# Patient Record
Sex: Male | Born: 1960 | Race: White | Hispanic: No | Marital: Married | Smoking: Current every day smoker
Health system: Southern US, Community
[De-identification: ages and names within clinical notes are randomized; demographics above are authoritative.]

## PROBLEM LIST (undated history)

## (undated) DIAGNOSIS — M1990 Unspecified osteoarthritis, unspecified site: Secondary | ICD-10-CM

## (undated) DIAGNOSIS — E039 Hypothyroidism, unspecified: Secondary | ICD-10-CM

## (undated) DIAGNOSIS — J449 Chronic obstructive pulmonary disease, unspecified: Secondary | ICD-10-CM

## (undated) DIAGNOSIS — IMO0002 Reserved for concepts with insufficient information to code with codable children: Secondary | ICD-10-CM

## (undated) DIAGNOSIS — T7840XA Allergy, unspecified, initial encounter: Secondary | ICD-10-CM

## (undated) DIAGNOSIS — E079 Disorder of thyroid, unspecified: Secondary | ICD-10-CM

## (undated) DIAGNOSIS — Z952 Presence of prosthetic heart valve: Secondary | ICD-10-CM

## (undated) DIAGNOSIS — Z8619 Personal history of other infectious and parasitic diseases: Secondary | ICD-10-CM

## (undated) HISTORY — DX: Personal history of other infectious and parasitic diseases: Z86.19

## (undated) HISTORY — DX: Reserved for concepts with insufficient information to code with codable children: IMO0002

## (undated) HISTORY — DX: Allergy, unspecified, initial encounter: T78.40XA

## (undated) HISTORY — DX: Unspecified osteoarthritis, unspecified site: M19.90

## (undated) HISTORY — DX: Hypothyroidism, unspecified: E03.9

## (undated) HISTORY — PX: CARDIAC SURGERY: SHX584

## (undated) HISTORY — DX: Presence of prosthetic heart valve: Z95.2

## (undated) HISTORY — PX: AORTA SURGERY: SHX548

---

## 1998-10-25 HISTORY — PX: CARDIAC VALVE SURGERY: SHX40

## 1999-03-27 HISTORY — PX: LUNG SURGERY: SHX703

## 2010-01-24 HISTORY — PX: LUNG BIOPSY: SHX5088

## 2011-04-02 ENCOUNTER — Institutional Professional Consult (permissible substitution): Payer: Self-pay | Admitting: Pulmonary Disease

## 2011-06-28 ENCOUNTER — Institutional Professional Consult (permissible substitution): Payer: Self-pay | Admitting: Internal Medicine

## 2011-10-11 ENCOUNTER — Emergency Department (HOSPITAL_COMMUNITY): Payer: Medicare Other

## 2011-10-11 ENCOUNTER — Other Ambulatory Visit: Payer: Self-pay

## 2011-10-11 ENCOUNTER — Emergency Department (HOSPITAL_COMMUNITY)
Admission: EM | Admit: 2011-10-11 | Discharge: 2011-10-12 | Disposition: A | Discharge status: Home / Self Care | Payer: Medicare Other | Attending: Emergency Medicine | Admitting: Emergency Medicine

## 2011-10-11 ENCOUNTER — Encounter (HOSPITAL_COMMUNITY): Payer: Self-pay | Admitting: Emergency Medicine

## 2011-10-11 DIAGNOSIS — J4489 Other specified chronic obstructive pulmonary disease: Secondary | ICD-10-CM | POA: Insufficient documentation

## 2011-10-11 DIAGNOSIS — R64 Cachexia: Secondary | ICD-10-CM | POA: Insufficient documentation

## 2011-10-11 DIAGNOSIS — J449 Chronic obstructive pulmonary disease, unspecified: Secondary | ICD-10-CM | POA: Insufficient documentation

## 2011-10-11 DIAGNOSIS — R011 Cardiac murmur, unspecified: Secondary | ICD-10-CM | POA: Insufficient documentation

## 2011-10-11 DIAGNOSIS — R079 Chest pain, unspecified: Secondary | ICD-10-CM | POA: Insufficient documentation

## 2011-10-11 DIAGNOSIS — Z79899 Other long term (current) drug therapy: Secondary | ICD-10-CM | POA: Insufficient documentation

## 2011-10-11 DIAGNOSIS — R42 Dizziness and giddiness: Secondary | ICD-10-CM

## 2011-10-11 DIAGNOSIS — R0602 Shortness of breath: Secondary | ICD-10-CM | POA: Insufficient documentation

## 2011-10-11 DIAGNOSIS — E079 Disorder of thyroid, unspecified: Secondary | ICD-10-CM | POA: Insufficient documentation

## 2011-10-11 HISTORY — DX: Disorder of thyroid, unspecified: E07.9

## 2011-10-11 HISTORY — DX: Chronic obstructive pulmonary disease, unspecified: J44.9

## 2011-10-11 LAB — CBC
HCT: 32 % — ABNORMAL LOW (ref 39.0–52.0)
Hemoglobin: 10.3 g/dL — ABNORMAL LOW (ref 13.0–17.0)
MCH: 28 pg (ref 26.0–34.0)
MCV: 87 fL (ref 78.0–100.0)
RBC: 3.68 MIL/uL — ABNORMAL LOW (ref 4.22–5.81)

## 2011-10-11 LAB — COMPREHENSIVE METABOLIC PANEL
Alkaline Phosphatase: 76 U/L (ref 39–117)
BUN: 14 mg/dL (ref 6–23)
Creatinine, Ser: 0.82 mg/dL (ref 0.50–1.35)
GFR calc Af Amer: >90 mL/min (ref >90)
Glucose, Bld: 85 mg/dL (ref 70–99)
Potassium: 4 mEq/L (ref 3.5–5.1)
Total Bilirubin: 0.2 mg/dL — ABNORMAL LOW (ref 0.3–1.2)
Total Protein: 6.8 g/dL (ref 6.0–8.3)

## 2011-10-11 LAB — DIFFERENTIAL
Eosinophils Absolute: 0.3 10*3/uL (ref 0.0–0.7)
Eosinophils Relative: 4 % (ref 0–5)
Lymphs Abs: 1.8 10*3/uL (ref 0.7–4.0)
Monocytes Absolute: 0.9 10*3/uL (ref 0.1–1.0)
Monocytes Relative: 10 % (ref 3–12)
Neutrophils Relative %: 65 % (ref 43–77)

## 2011-10-11 LAB — POCT I-STAT TROPONIN I: Troponin i, poc: 0 ng/mL (ref 0.00–0.08)

## 2011-10-11 MED ORDER — MECLIZINE HCL 50 MG PO TABS: 25.0000 mg | ORAL_TABLET | Freq: Three times a day (TID) | ORAL | Status: AC | PRN

## 2011-10-11 NOTE — ED Provider Notes (Signed)
Medical screening examination/treatment/procedure(s) were conducted as a shared visit with non-physician practitioner(s) and myself.  I personally evaluated the patient during the encounter The patient is a 51 year old, male, smoker who presents emergency department complaining of a cough with sputum, as well as right sided chest pain in his ribs.  He also has intermittent substernal chest pain is substernal chest pain.  However, it lasts only for one to 2 minutes.  He has had dizziness.  He describes as a spinning sensation.  With no nausea, vomiting, diarrhea he is pain-free at this time.  He is no distress.  On examination.  He is a cachectic male, with a normal neurological examination.  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  His abdomen is benign.  Lab tests do not show any signs of cardiac ischemia.  We will let him go and treat him symptomatically with meclizine to  Nicholes Stairs, MD 10/11/11 [phone]

## 2011-10-11 NOTE — ED Provider Notes (Signed)
History     CSN: [id number]  Arrival date & time 10/11/11  2023   First MD Initiated Contact with Patient 10/11/11 2052      No chief complaint on file.   (Consider location/radiation/quality/duration/timing/severity/associated sxs/prior treatment) HPI History is obtained from the patient and family member at bedside. He presents with several days of dizziness. He is unable to describe what kind of sensation he is feeling. It has not been associated with sudden movements or sudden turning of the head. Denies any associated hearing loss or tinnitus. He has not had any visual changes. He has had some associated chest pain but denies any shortness of breath. The pain is described as sharp in nature and is very intermittent, lasting only 1-2 sec at a time. He does have a history of COPD, but denies have any wheezing. Denies abdominal pain, nausea, vomiting, hematochezia or melena.  He is status post cardiac valve replacement with a metal valve, but is unsure which valve was replaced.  Past Medical History  Diagnosis Date  . COPD (chronic obstructive pulmonary disease)   . Thyroid disease     Past Surgical History  Procedure Date  . Cardiac surgery   . Lung surgery 03/1999  . Cardiac valve surgery 10/1998    metal valve  . Lung biopsy   Patient states his lung surgery was because he had a "fungus". States a portion of his right upper lung was removed. He is unsure which valve was replaced.  No family history on file.  History  Substance Use Topics  . Smoking status: Not on file  . Smokeless tobacco: Not on file  . Alcohol Use: No      Review of Systems  Constitutional: Negative for fever, chills, activity change and appetite change.  HENT: Negative for hearing loss, ear pain, nosebleeds, congestion, neck pain, sinus pressure and tinnitus.   Eyes: Negative for photophobia and visual disturbance.  Respiratory: Negative for chest tightness and shortness of breath.     Cardiovascular: Positive for chest pain. Negative for palpitations and leg swelling.  Gastrointestinal: Negative for nausea, vomiting and abdominal pain.  Musculoskeletal: Negative for myalgias.  Skin: Negative for color change and rash.  Neurological: Positive for dizziness. Negative for weakness, numbness and headaches.    Allergies  Penicillins  Home Medications   Current Outpatient Rx  Name Route Sig Dispense Refill  . ALBUTEROL SULFATE (2.5 MG/3ML) 0.083% IN NEBU Nebulization Take 2.5 mg by nebulization every 6 (six) hours as needed. For shortness of breath    . ARFORMOTEROL TARTRATE 15 MCG/2ML IN NEBU Nebulization Take 15 mcg by nebulization 2 (two) times daily.    . ASPIRIN EC 81 MG PO TBEC Oral Take 162 mg by mouth daily.    . ATENOLOL 25 MG PO TABS Oral Take 25 mg by mouth daily.    . BUDESONIDE 0.25 MG/2ML IN SUSP Nebulization Take 0.25 mg by nebulization 2 (two) times daily.    Marland Kitchen HYDROCODONE-ACETAMINOPHEN 5-325 MG PO TABS Oral Take 1 tablet by mouth every 8 (eight) hours as needed. For pain    . PANTOPRAZOLE SODIUM 40 MG PO TBEC Oral Take 40 mg by mouth daily.      BP 120/62  Pulse 57  Temp(Src) 98 F (36.7 C) (Oral)  Resp 16  SpO2 100%  Physical Exam  Nursing note and vitals reviewed. Constitutional: He is oriented to person, place, and time. He appears well-developed and well-nourished. No distress.       cachetic  HENT:  Head: Normocephalic and atraumatic.  Right Ear: External ear normal.  Left Ear: External ear normal.  Eyes: Conjunctivae and EOM are normal. Pupils are equal, round, and reactive to light. Right eye exhibits no discharge. Left eye exhibits no discharge.  Neck: Normal range of motion.  Cardiovascular: Normal rate and regular rhythm.   Murmur heard.      Click c/w mech valve  Pulmonary/Chest: Effort normal and breath sounds normal.  Abdominal: Soft. Bowel sounds are normal. There is no tenderness. There is no rebound and no guarding.   Musculoskeletal: Normal range of motion.  Neurological: He is alert and oriented to person, place, and time. No cranial nerve deficit. Coordination normal.       No nystagmus noted with EOMs  Skin: Skin is warm and dry. No rash noted. He is not diaphoretic.  Psychiatric: He has a normal mood and affect.    ED Course  Procedures (including critical care time)   Date: 10/11/2011  Rate: 58  Rhythm: normal sinus rhythm  QRS Axis: normal  Intervals: normal  ST/T Wave abnormalities: diffuse ST elevations  Conduction Disutrbances:none  Narrative Interpretation: LVH  Old EKG Reviewed: none available  Labs Reviewed  CBC - Abnormal; Notable for the following:    RBC 3.68 (*)    Hemoglobin 10.3 (*)    HCT 32.0 (*)    All other components within normal limits  COMPREHENSIVE METABOLIC PANEL - Abnormal; Notable for the following:    Sodium 132 (*)    Albumin 3.0 (*)    Total Bilirubin 0.2 (*)    All other components within normal limits  DIFFERENTIAL  POCT I-STAT TROPONIN I   Dg Chest 2 View  10/11/2011  *RADIOLOGY REPORT*  Clinical Data: Dizziness and shortness of breath  CHEST - 2 VIEW  Comparison: None.  Findings: There are changes of median sternotomy.  Prosthetic valve is noted, favored to be an aortic valve.  There is extensive pleuroparenchymal scarring at the lung apices and retraction of the hila bilaterally. There may be bleb formation at the right lung apex.  Surgical suture is seen in the region of probable scarring in the right upper lobe.  There may be emphysematous changes. Calcified granuloma in the left upper lobe.  No visible pleural effusion.  No acute bony abnormality  IMPRESSION: 1.  Extensive pleuroparenchymal opacities in the lung apices bilaterally with retraction of the hila.  This suggests extensive scarring from prior granulomatous disease, possibly tuberculosis, or pneumoconiosis.  Suggest correlation with patient history. There may have been a previous lung biopsy  given sutures in the right lung.  There are no prior chest radiographs for comparison. 2.  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  Original Report Authenticated By: Britta Mccreedy, M.D.   I personally reviewed the pt's films.   1. Dizziness       MDM  9:34 PM Patient assessed. EKG with diffuse ST elevation, but given the very intermittent nature of chest pain, low suspicion for pericarditis. Plan to obtain basic labs, including CBC, CMP, troponin, chest x-ray.  11:56 PM Case d/w Dr. Weldon Inches who saw the pt with me. His labs are unremarkable other than anemia to 10.3 hgb which may be attributable to his prosthetic valve. Will treat for dizziness with trial of meclizine. Instructed to use NSAIDs prn. Return precautions discussed.        Grant Fontana, [place] 10/12/11 0004

## 2011-10-11 NOTE — ED Notes (Signed)
In via EMS with c/o Shortness of breath  More than usual and  Dizziness. Times approx 3 days

## 2011-10-11 NOTE — Discharge Instructions (Signed)
Your labs today were normal. You have been given a prescription for meclizine, a medication to help with dizziness. Take this as needed. You may try ibuprofen as needed for your chest pain. If you are having worsening dizziness, trouble speaking or walking, chest pain, or shortness of breath, please return to the ER.  RESOURCE GUIDE  Dental Problems  Patients with Medicaid: [facility] [phone] [address]                                           [phone] [address] Phone:  [phone]                                                  Phone:  [phone]  If unable to pay or uninsured, contact:  [facility] or [facility] to become qualified for the adult dental clinic.  Chronic Pain Problems Contact [facility]  [phone] Patients need to be referred by their primary care doctor.  Insufficient Money for Medicine Contact [employer] Way:  call "211" or [facility] [phone].  No Primary Care Doctor Call [facility]  [phone] Other agencies that provide inexpensive medical care    [facility]  [phone]    [facility]  [phone]    [facility]  [phone]    [facility]  [phone]    [facility]  [phone]    [facility]  [phone]  Psychological Services [facility]  [phone] [facility] Services  [phone] [facility]   [phone] (emergency services [phone])  Substance Abuse Resources [facility]  (989) [phone] [facility] (813)185-0375 [facility] ([phone] Floydene Flock 7048779439 Residential & Outpatient Substance Abuse Program  ([phone]  Abuse/Neglect [facility] Child Abuse Hotline 928-016-9121 [facility] Child Abuse Hotline (438) 793-9110 (After Hours)  Emergency Shelter [facility] [phone]  Maternity Homes Room at  the [place] [facility] [place] 7163959370 [facility] [phone]  MRSA Hotline #:   934 136 6167    [facility] Resources  [facility] [place]     [employer]                          [facility] Dept. [address] [place]                       [address]      [address]  [place]                                                Cristobal Goldmann Phone:  [phone]  Phone:  [phone]                 Phone:  [phone]  [facility] Phone:  [phone]  [facility] Child Abuse Hotline 845 735 0072 (340) 099-0263 (After Hours)  Dizziness Dizziness is a common problem. It is a feeling of unsteadiness or lightheadedness. You may feel like you are about to faint. Dizziness can lead to injury if you stumble or fall. A person of any age group can suffer from dizziness, but dizziness is more common in older adults. CAUSES  Dizziness can be caused by many different things, including:  Middle ear problems.   Standing for too long.   Infections.   An allergic reaction.   Aging.   An emotional response to something, such as the sight of blood.   Side effects of medicines.   Fatigue.   Problems with circulation or blood pressure.   Excess use of alcohol, medicines, or illegal drug use.   Breathing too fast (hyperventilation).   An arrhythmia or problems with your heart rhythm.   Anemia or a low blood count.   Pregnancy.   Vomiting, diarrhea, fever, or other illnesses that cause dehydration.   Diseases or conditions such as Parkinson's disease, high blood pressure (hypertension), diabetes, and thyroid problems.   Exposure to extreme heat.  DIAGNOSIS  To find the cause of your dizziness, your caregiver may do a physical exam, lab tests, radiologic imaging scans, or an electrocardiography test (ECG).  TREATMENT  Treatment of  dizziness depends on the cause of your symptoms and can vary greatly. HOME CARE INSTRUCTIONS   Drink enough fluids to keep your urine clear or pale yellow. This is especially important in very hot weather. In the elderly, it is also important in cold weather.   If your dizziness is caused by medicines, take them exactly as directed. When taking blood pressure medicines, it is especially important to get up slowly.   Rise slowly from chairs and steady yourself until you feel okay.   In the morning, first sit up on the side of the bed. When this seems okay, stand slowly while holding onto something until you know your balance is fine.   If you need to stand in one place for a long time, be sure to move your legs often. Tighten and relax the muscles in your legs while standing.   If dizziness continues to be a problem, have someone stay with you for a day or two. Do this until you feel you are well enough to stay alone. Have the person call your caregiver if he or she notices changes in you that are concerning.   Do not drive or use heavy machinery if you feel dizzy.  SEEK IMMEDIATE MEDICAL CARE IF:   Your dizziness or lightheadedness gets worse.   You feel nauseous or vomit.   You develop problems with talking, walking, weakness, or using your arms, hands, or legs.   You are not thinking clearly or you have difficulty forming sentences. It may take a friend or family member to determine if your thinking is normal.   You develop chest pain, abdominal pain, shortness of breath, or sweating.   Your vision changes.   You notice any bleeding.   You have side effects from medicine that seems to be getting worse rather than better.  MAKE SURE YOU:   Understand these instructions.   Will watch your condition.   Will get help right away if you  are not doing well or get worse.  Document Released: 02/05/2001 Document Revised: 04/16/2011 Document Reviewed: 03/01/2011 [facility] Patient  Information 2012 [place], [place].

## 2011-10-12 NOTE — ED Provider Notes (Signed)
Medical screening examination/treatment/procedure(s) were conducted as a shared visit with non-physician practitioner(s) and myself.  I personally evaluated the patient during the encounter  Nicholes Stairs, MD 10/12/11 [phone]

## 2012-04-09 ENCOUNTER — Encounter: Payer: Self-pay | Admitting: *Deleted

## 2012-04-10 ENCOUNTER — Institutional Professional Consult (permissible substitution): Payer: Medicare Other | Admitting: Internal Medicine

## 2012-04-21 ENCOUNTER — Institutional Professional Consult (permissible substitution): Payer: Medicare Other | Admitting: Internal Medicine

## 2012-06-08 ENCOUNTER — Institutional Professional Consult (permissible substitution): Payer: Medicare Other | Admitting: Internal Medicine

## 2012-08-03 ENCOUNTER — Other Ambulatory Visit (INDEPENDENT_AMBULATORY_CARE_PROVIDER_SITE_OTHER): Payer: Medicare Other

## 2012-08-03 ENCOUNTER — Ambulatory Visit (INDEPENDENT_AMBULATORY_CARE_PROVIDER_SITE_OTHER): Payer: Medicare Other | Admitting: Internal Medicine

## 2012-08-03 ENCOUNTER — Ambulatory Visit (INDEPENDENT_AMBULATORY_CARE_PROVIDER_SITE_OTHER)
Admission: RE | Admit: 2012-08-03 | Discharge: 2012-08-03 | Disposition: A | Discharge status: Home / Self Care | Payer: Medicare Other | Source: Ambulatory Visit | Attending: Internal Medicine | Admitting: Internal Medicine

## 2012-08-03 ENCOUNTER — Encounter: Payer: Self-pay | Admitting: Internal Medicine

## 2012-08-03 VITALS — BP 110/76 | HR 59 | Ht 67.0 in | Wt 92.6 lb

## 2012-08-03 DIAGNOSIS — J42 Unspecified chronic bronchitis: Secondary | ICD-10-CM

## 2012-08-03 DIAGNOSIS — B4481 Allergic bronchopulmonary aspergillosis: Secondary | ICD-10-CM

## 2012-08-03 DIAGNOSIS — B449 Aspergillosis, unspecified: Secondary | ICD-10-CM

## 2012-08-03 DIAGNOSIS — B441 Other pulmonary aspergillosis: Secondary | ICD-10-CM

## 2012-08-03 DIAGNOSIS — R05 Cough: Secondary | ICD-10-CM

## 2012-08-03 LAB — IGG, IGA, IGM
IgA: 516 mg/dL — ABNORMAL HIGH (ref 68–379)
IgM, Serum: 165 mg/dL (ref 41–251)

## 2012-08-03 LAB — CBC WITH DIFFERENTIAL/PLATELET
Basophils Absolute: 0 10*3/uL (ref 0.0–0.1)
Eosinophils Absolute: 0.2 10*3/uL (ref 0.0–0.7)
Lymphocytes Relative: 15.1 % (ref 12.0–46.0)
MCHC: 32 g/dL (ref 30.0–36.0)
Neutrophils Relative %: 72.4 % (ref 43.0–77.0)
RBC: 4.47 Mil/uL (ref 4.22–5.81)
RDW: 17 % — ABNORMAL HIGH (ref 11.5–14.6)

## 2012-08-03 NOTE — Patient Instructions (Addendum)
Order- CXR                                                                                                Dx hx Aspergillus pneumonia   Schedule PFT  Lab- CBC, immunoglobulins IgG/IgA/IgM  And also IgE              Aspergilllus serology                       Sputum for routine, fungal and AFB smear and culture

## 2012-08-03 NOTE — Progress Notes (Signed)
08/03/12- 51 yoM current 1 ppd smoker seen on kind referral from Dr Sigmund Hazel concerned about "hole in left lung/fungus"  Wife here. He says he has just quit smoking. Most of history is given by his wife. 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 He was treated for a year and a half. 2011 he had sepsis complicated by respiratory failure and bilateral pneumothorax after a lung biopsy. Since then he denies routine fever. He has chronic cough productive of green nonbloody sputum. Chronic tussive right lateral rib pain which may be post chest tube. He had lost weight but considers his normal weight 102 pounds. Some shortness of breath especially with exertion, already better in a few days since he stopped smoking. Test for tuberculosis have always been negative.  In March of 2000 he had aortic valve replacement for aortic stenosis with chronic Coumadin. 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 He doesn't know of significant family pulmonary history.  CXR 10/11/11-images reviewed with them IMPRESSION:  1. Extensive pleuroparenchymal opacities in the lung apices  bilaterally with retraction of the hila. This suggests extensive  scarring from prior granulomatous disease, possibly tuberculosis,  or pneumoconiosis. Suggest correlation with patient history.  There may have been a previous lung biopsy given sutures in the  right lung. There are no prior chest radiographs for comparison.  2. 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  Original Report Authenticated By: Britta Mccreedy, M.D.   Prior to Admission medications   Medication Sig Start Date End Date Taking? Authorizing Provider  albuterol (PROVENTIL) (2.5 MG/3ML) 0.083% nebulizer solution Take 2.5 mg by nebulization every 6 (six) hours as needed. For shortness of breath   Yes Historical Provider, MD  arformoterol (BROVANA) 15 MCG/2ML NEBU Take 15 mcg by  nebulization 2 (two) times daily.   Yes Historical Provider, MD  budesonide (PULMICORT) 0.25 MG/2ML nebulizer solution Take 0.25 mg by nebulization 2 (two) times daily.   Yes Historical Provider, MD  levothyroxine (SYNTHROID, LEVOTHROID) 75 MCG tablet Take 75 mcg by mouth daily.   Yes Historical Provider, MD  OXYGEN-HELIUM IN Inhale into the lungs. 2L [facility]   Yes Historical Provider, MD  PARoxetine (PAXIL) 10 MG tablet Take 10 mg by mouth every morning.   Yes Historical Provider, MD  warfarin (COUMADIN) 2 MG tablet Take 2 mg by mouth daily.   Yes Historical Provider, MD   Past Medical History  Diagnosis Date  . COPD (chronic obstructive pulmonary disease)   . Thyroid disease   . History of heart valve replacement   . Hypothyroid    Past Surgical History  Procedure Date  . Cardiac surgery   . Lung surgery 03/1999  . Cardiac valve surgery 10/1998    metal valve  . Lung biopsy    Family History  Problem Relation Age of Onset  . Heart attack Father    History   Social History  . Marital Status: Married    Spouse Name: N/A    Number of Children: N/A  . Years of Education: N/A   Occupational History  . Not on file.   Social History Main Topics  . Smoking status: Former Smoker -- 1.0 packs/day    Types: Cigarettes    Quit date: 07/27/2012  . Smokeless tobacco: Not on file  . Alcohol Use: Yes     Comment: 1-2 per week  . Drug Use: Not on file  .  Sexually Active: Not on file   Other Topics Concern  . Not on file   Social History Narrative  . No narrative on file   ROS-see HPI Constitutional:   +  weight loss, no-night sweats, fevers, chills, fatigue, lassitude. HEENT:   No-  headaches, difficulty swallowing, tooth/dental problems, sore throat,       No-  sneezing, itching, ear ache, +nasal congestion, post nasal drip,  CV:  No-   chest pain, orthopnea, PND, swelling in lower extremities, anasarca, dizziness, palpitations Resp: +shortness of breath with  exertion or at rest.              +  productive cough,  No non-productive cough,  No- coughing up of blood.              No-   change in color of mucus.  No- wheezing.   Skin: + rash or lesions. GI:  No-   heartburn, indigestion, abdominal pain, nausea, vomiting, diarrhea,                 change in bowel habits, loss of appetite GU: No-   dysuria, change in color of urine, no urgency or frequency.  No- flank pain. MS:  + joint pain or swelling.  No- decreased range of motion.  No- back pain. Neuro-     nothing unusual Psych:  No- change in mood or affect. + depression or anxiety.  No memory loss.  OBJ- Physical Exam General- Alert, Oriented, Affect-appropriate, Distress- none acute. Very thin and chronically ill appearing. Skin- rash-none, lesions- none, excoriation- none Lymphadenopathy- none Head- atraumatic            Eyes- Gross vision intact, PERRLA, conjunctivae and secretions clear            Ears- Hearing, canals-normal            Nose- Clear, no-Septal dev, mucus, polyps, erosion, perforation             Throat- Mallampati III , mucosa clear , drainage- none, tonsils- atrophic. Edentulous. Neck- flexible , trachea midline, no stridor , thyroid nl, carotid no bruit Chest - symmetrical excursion , unlabored           Heart/CV- RRR , no murmur, +crisp regular valve sound , no gallop  , no rub, nl s1 s2                           - JVD- none , edema- none, stasis changes- none, varices- none           Lung- + very distant without wheeze or cough , dullness-none, rub- none           Chest wall-  Abd- tender-no, distended-no, bowel sounds-present, HSM- no Br/ Gen/ Rectal- Not done, not indicated Extrem- cyanosis- none, clubbing, none, atrophy- none, strength- nl Neuro- grossly intact to observation

## 2012-08-06 ENCOUNTER — Telehealth: Payer: Self-pay | Admitting: Internal Medicine

## 2012-08-06 NOTE — Telephone Encounter (Signed)
Spoke with Veterinary surgeon at [facility]. She is aware of the 3 tests CY wants and updated her information as follows:   [phone]. This was reviewed with CY.

## 2012-08-10 DIAGNOSIS — J439 Emphysema, unspecified: Secondary | ICD-10-CM | POA: Insufficient documentation

## 2012-08-10 DIAGNOSIS — B441 Other pulmonary aspergillosis: Secondary | ICD-10-CM | POA: Insufficient documentation

## 2012-08-10 NOTE — Assessment & Plan Note (Signed)
Plan-we need to clarify dates and previous diagnosis and treatment. I'm not certain if he had a fungus ball or aspergillus pneumonia. Plan-sputum cultures. Immunoglobulins and total IgE. He says insurance will not cover pneumonia vaccine.

## 2012-08-10 NOTE — Assessment & Plan Note (Signed)
Severely scarred lungs. Plan-PFT, chest x-ray. He may be a candidate for pulmonary rehabilitation.

## 2012-08-12 LAB — ALLERGEN A NIGER M207: Aspergillus niger, m207: <0.1 kU/L

## 2012-09-14 ENCOUNTER — Ambulatory Visit: Payer: Medicare Other | Admitting: Internal Medicine

## 2012-09-28 ENCOUNTER — Other Ambulatory Visit (INDEPENDENT_AMBULATORY_CARE_PROVIDER_SITE_OTHER): Payer: Medicare Other

## 2012-09-28 ENCOUNTER — Ambulatory Visit (INDEPENDENT_AMBULATORY_CARE_PROVIDER_SITE_OTHER): Payer: Medicare Other | Admitting: Internal Medicine

## 2012-09-28 ENCOUNTER — Encounter: Payer: Self-pay | Admitting: Internal Medicine

## 2012-09-28 VITALS — BP 110/72 | HR 59 | Ht 67.0 in | Wt 97.4 lb

## 2012-09-28 DIAGNOSIS — B441 Other pulmonary aspergillosis: Secondary | ICD-10-CM

## 2012-09-28 DIAGNOSIS — J42 Unspecified chronic bronchitis: Secondary | ICD-10-CM

## 2012-09-28 DIAGNOSIS — J984 Other disorders of lung: Secondary | ICD-10-CM

## 2012-09-28 DIAGNOSIS — B4481 Allergic bronchopulmonary aspergillosis: Secondary | ICD-10-CM

## 2012-09-28 DIAGNOSIS — J479 Bronchiectasis, uncomplicated: Secondary | ICD-10-CM

## 2012-09-28 LAB — BASIC METABOLIC PANEL
BUN: 22 mg/dL (ref 6–23)
CO2: 29 mEq/L (ref 19–32)
Calcium: 9.4 mg/dL (ref 8.4–10.5)
Creatinine, Ser: 1 mg/dL (ref 0.4–1.5)
Glucose, Bld: 82 mg/dL (ref 70–99)
Sodium: 138 mEq/L (ref 135–145)

## 2012-09-28 NOTE — Patient Instructions (Addendum)
Verify scheduling of PFT for dx cavitary lung disease, bronchiectasis  Order- Schedule CT chest with contrast               BMET

## 2012-09-28 NOTE — Progress Notes (Signed)
08/03/12- 51 yoM current 1 ppd smoker seen on kind referral from Dr Sigmund Hazel concerned about "hole in left lung/fungus"  Wife here. He says he has just quit smoking. Most of history is given by his wife. 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 He was treated for a year and a half. 2011 he had sepsis complicated by respiratory failure and bilateral pneumothorax after a lung biopsy. Since then he denies routine fever. He has chronic cough productive of green nonbloody sputum. Chronic tussive right lateral rib pain which may be post chest tube. He had lost weight but considers his normal weight 102 pounds. Some shortness of breath especially with exertion, already better in a few days since he stopped smoking. Test for tuberculosis have always been negative.  In March of 2000 he had aortic valve replacement for aortic stenosis with chronic Coumadin. 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 He doesn't know of significant family pulmonary history.  09/28/12- 73 yoM former smoker seen on kind referral from Dr Sigmund Hazel concerned about "hole in left lung/fungus"  Wife here FOLLOWS FOR: review labs in detail with patient; pt has gained 5# since last visit; has had slight cough-non productive. He admits he is still smoking some but "working on it" Little cough or phlegm. No sweats or fever. CXR 08/05/12-reviewed IMPRESSION:  No acute disease. Extensive chronic changes in both upper lobes.  Original Report Authenticated By: Francene Boyers, M.D. Immunoglobulins: IgG increased 1750, IgA increased 516, IgM normal, IgE 51.7. AFB smear negative in 6 weeks WBC 8600 with eosinophils 1.8%  ROS-see HPI Constitutional:   No- weight loss, no-night sweats, fevers, chills, fatigue, lassitude. HEENT:   No-  headaches, difficulty swallowing, tooth/dental problems, sore throat,       No-  sneezing, itching, ear ache, +nasal congestion,  post nasal drip,  CV:  No-   chest pain, orthopnea, PND, swelling in lower extremities, anasarca, dizziness, palpitations Resp: +shortness of breath with exertion or at rest.              +  productive cough,  No non-productive cough,  No- coughing up of blood.              No-   change in color of mucus.  No- wheezing.   Skin: + rash or lesions. GI:  No-   heartburn, indigestion, abdominal pain, nausea, vomiting, GU: . MS:  + joint pain or swelling.   Neuro-     nothing unusual Psych:  No- change in mood or affect. + depression or anxiety.  No memory loss.  OBJ- Physical Exam General- Alert, Oriented, Affect-appropriate, Distress- none acute. Very thin and chronically ill appearing. Passive, wife does most of the talking Skin- rash-none, lesions- none, excoriation- none Lymphadenopathy- none Head- atraumatic            Eyes- Gross vision intact, PERRLA, conjunctivae and secretions clear            Ears- Hearing, canals-normal            Nose- Clear, no-Septal dev, mucus, polyps, erosion, perforation             Throat- Mallampati III , mucosa clear , drainage- none, tonsils- atrophic. Edentulous. Neck- flexible , trachea midline, no stridor , thyroid nl, carotid no bruit Chest - symmetrical excursion , unlabored           Heart/CV- RRR ,  no murmur, +crisp regular valve sound , no gallop  , no rub, nl s1 s2                           - JVD- none , edema- none, stasis changes- none, varices- none           Lung- + very distant without wheeze or cough , dullness-none, rub- none           Chest wall-  Abd-  Br/ Gen/ Rectal- Not done, not indicated Extrem- cyanosis- none, clubbing, none, atrophy- none, strength- nl Neuro- grossly intact to observation

## 2012-10-01 ENCOUNTER — Ambulatory Visit (HOSPITAL_COMMUNITY)
Admission: RE | Admit: 2012-10-01 | Discharge: 2012-10-01 | Disposition: A | Discharge status: Home / Self Care | Payer: Medicare Other | Source: Ambulatory Visit | Attending: Internal Medicine | Admitting: Internal Medicine

## 2012-10-01 ENCOUNTER — Encounter (HOSPITAL_COMMUNITY): Payer: Medicare Other

## 2012-10-01 DIAGNOSIS — F17200 Nicotine dependence, unspecified, uncomplicated: Secondary | ICD-10-CM | POA: Insufficient documentation

## 2012-10-01 DIAGNOSIS — B449 Aspergillosis, unspecified: Secondary | ICD-10-CM

## 2012-10-01 DIAGNOSIS — J438 Other emphysema: Secondary | ICD-10-CM | POA: Insufficient documentation

## 2012-10-01 DIAGNOSIS — J984 Other disorders of lung: Secondary | ICD-10-CM | POA: Insufficient documentation

## 2012-10-01 DIAGNOSIS — J479 Bronchiectasis, uncomplicated: Secondary | ICD-10-CM

## 2012-10-01 MED ORDER — IOHEXOL 300 MG/ML  SOLN
100.0000 mL | Freq: Once | INTRAMUSCULAR | Status: AC | PRN
  Administered 2012-10-01: 80 mL via INTRAVENOUS

## 2012-10-01 MED ORDER — ALBUTEROL SULFATE (5 MG/ML) 0.5% IN NEBU
2.5000 mg | INHALATION_SOLUTION | Freq: Once | RESPIRATORY_TRACT | Status: AC
  Administered 2012-10-01: 2.5 mg via RESPIRATORY_TRACT

## 2012-10-05 ENCOUNTER — Encounter: Payer: Self-pay | Admitting: Internal Medicine

## 2012-10-05 NOTE — Assessment & Plan Note (Signed)
If he has active inflammatory disease we want to find the cause. For now I think we can safely follow over time Chest x-ray is pending.

## 2012-10-05 NOTE — Assessment & Plan Note (Signed)
Documented and treated with no evidence for ongoing atypical infection.

## 2012-10-07 NOTE — Progress Notes (Signed)
Quick Note:  Spoke with wife-aware of results. ______

## 2012-11-02 ENCOUNTER — Encounter: Payer: Self-pay | Admitting: Internal Medicine

## 2012-11-02 ENCOUNTER — Ambulatory Visit (INDEPENDENT_AMBULATORY_CARE_PROVIDER_SITE_OTHER): Payer: Medicare Other | Admitting: Internal Medicine

## 2012-11-02 VITALS — BP 112/68 | HR 75 | Ht 67.0 in | Wt 96.8 lb

## 2012-11-02 DIAGNOSIS — J42 Unspecified chronic bronchitis: Secondary | ICD-10-CM

## 2012-11-02 DIAGNOSIS — B470 Eumycetoma: Secondary | ICD-10-CM

## 2012-11-02 DIAGNOSIS — Z954 Presence of other heart-valve replacement: Secondary | ICD-10-CM

## 2012-11-02 DIAGNOSIS — Z952 Presence of prosthetic heart valve: Secondary | ICD-10-CM | POA: Insufficient documentation

## 2012-11-02 NOTE — Progress Notes (Signed)
08/03/12- 51 yoM current 1 ppd smoker seen on kind referral from Dr Sigmund Hazel concerned about "hole in left lung/fungus"  Wife here. He says he has just quit smoking. Most of history is given by his wife. 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 He was treated for a year and a half. 2011 he had sepsis complicated by respiratory failure and bilateral pneumothorax after a lung biopsy. Since then he denies routine fever. He has chronic cough productive of green nonbloody sputum. Chronic tussive right lateral rib pain which may be post chest tube. He had lost weight but considers his normal weight 102 pounds. Some shortness of breath especially with exertion, already better in a few days since he stopped smoking. Test for tuberculosis have always been negative.  In March of 2000 he had aortic valve replacement for aortic stenosis with chronic Coumadin. 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 He doesn't know of significant family pulmonary history.  09/28/12- 27 yoM former smoker seen on kind referral from Dr Sigmund Hazel concerned about "hole in left lung/fungus"  Wife here FOLLOWS FOR: review labs in detail with patient; pt has gained 5# since last visit; has had slight cough-non productive. He admits he is still smoking some but "working on it" Little cough or phlegm. No sweats or fever. CXR 08/05/12-reviewed IMPRESSION:  No acute disease. Extensive chronic changes in both upper lobes.  Original Report Authenticated By: Francene Boyers, M.D. Immunoglobulins: IgG increased 1750, IgA increased 516, IgM normal, IgE 51.7. AFB smear negative in 6 weeks WBC 8600 with eosinophils 1.8%  11/02/12- 51 yoM smoker seen on kind referral from Dr Sigmund Hazel for fibrocavitary lung disease, hx aspergillosis, fungus ball LUL, tobacco abuse, COPD  Wife here. FOLLOWS FOR: review PFT with patient; only coughs (mainly) when smoking; Denies  any SOB. Needs new nebulizer machine through [facility]. There has been no significant effort to stop smoking. I discussed this with them again today. He coughs some with little phlegm and no blood, chest pain, night sweats or fever. We reviewed the images from his latest chest CT showing significant emphysema, dramatic upper zone fibrotic cavitary scarring, and a filling defect strongly consistent with a fungus ball in a left apical cavity. Sputum culture is negative for AFB at 6 weeks CT chest 10/07/12 IMPRESSION:  1. Emphysema with extensive pleural - parenchymal scarring,  bullous disease, and mild bronchiectasis in both upper lobes.  2. 2 cm filling defect within a posterior left upper lobe bulla,  consistent with mycetoma.  3. No evidence of neoplasm or other active lung disease.  Original Report Authenticated By: Myles Rosenthal, M.D. PFT: [facility] 10/01/2012-moderate obstructive airways disease with response to bronchodilator, severe restriction of total lung capacity, air trapping, severe diffusion reduction. FVC 1.97/44%, FEV1 1.75/50%, FEV1/FVC 0.89/115%, FEF 25-75% 1.55/51%. TLC 77%, RV 147%,DLCO 19%  ROS-see HPI Constitutional:   No- weight loss, no-night sweats, fevers, chills, fatigue, lassitude. HEENT:   No-  headaches, difficulty swallowing, tooth/dental problems, sore throat,       No-  sneezing, itching, ear ache, +nasal congestion, post nasal drip,  CV:  No-   chest pain, orthopnea, PND, swelling in lower extremities, anasarca, dizziness, palpitations Resp: +shortness of breath with exertion or at rest.              No-  productive cough,  + non-productive cough,  No- coughing up of blood.  No-   change in color of mucus.  No- wheezing.   Skin: + rash or lesions. GI:  No-   heartburn, indigestion, abdominal pain, nausea, vomiting, GU: . MS:  + joint pain or swelling.   Neuro-     nothing unusual Psych:  No- change in mood or affect. + depression or anxiety.  No  memory loss.  OBJ- Physical Exam General- Alert, Oriented, Affect-appropriate, Distress- none acute. Very thin and chronically ill appearing. Passive, wife does most of the talking. Strong odor of tobacco in the exam room. Skin- rash-none, lesions- none, excoriation- none Lymphadenopathy- none Head- atraumatic            Eyes- Gross vision intact, PERRLA, conjunctivae and secretions clear            Ears- Hearing, canals-normal            Nose- Clear, no-Septal dev, mucus, polyps, erosion, perforation             Throat- Mallampati III , mucosa clear , drainage- none, tonsils- atrophic. Edentulous. Neck- flexible , trachea midline, no stridor , thyroid nl, carotid no bruit Chest - symmetrical excursion , unlabored           Heart/CV- RRR , no murmur, +crisp regular valve sound , no gallop  , no rub, nl s1 s2                           - JVD- none , edema- none, stasis changes- none, varices- none           Lung- + very distant without wheeze or cough , dullness-none, rub- none           Chest wall-  Abd-  Br/ Gen/ Rectal- Not done, not indicated Extrem- cyanosis- none, clubbing, none, atrophy- none, strength- nl Neuro- grossly intact to observation

## 2012-11-02 NOTE — Patient Instructions (Addendum)
It is really important to stop smoking. You don't have any spare lung any more.  Please call if needed

## 2012-11-02 NOTE — Assessment & Plan Note (Signed)
Is not a candidate for surgical resection because of very poor pulmonary reserve. He is currently asymptomatic for this fungus ball. I've discussed reporting of any hemoptysis.

## 2012-11-05 ENCOUNTER — Other Ambulatory Visit: Payer: Self-pay | Admitting: Internal Medicine

## 2012-11-05 DIAGNOSIS — J4489 Other specified chronic obstructive pulmonary disease: Secondary | ICD-10-CM | POA: Insufficient documentation

## 2012-11-05 DIAGNOSIS — J449 Chronic obstructive pulmonary disease, unspecified: Secondary | ICD-10-CM | POA: Insufficient documentation

## 2012-11-13 ENCOUNTER — Ambulatory Visit: Payer: Medicare Other | Admitting: Internal Medicine

## 2012-11-16 ENCOUNTER — Ambulatory Visit: Payer: Medicare Other | Admitting: Internal Medicine

## 2012-12-03 ENCOUNTER — Encounter: Payer: Self-pay | Admitting: Internal Medicine

## 2012-12-03 ENCOUNTER — Other Ambulatory Visit (INDEPENDENT_AMBULATORY_CARE_PROVIDER_SITE_OTHER): Payer: Medicare Other

## 2012-12-03 ENCOUNTER — Ambulatory Visit (INDEPENDENT_AMBULATORY_CARE_PROVIDER_SITE_OTHER): Payer: Medicare HMO | Admitting: Internal Medicine

## 2012-12-03 VITALS — BP 102/60 | HR 78 | Temp 97.9°F | Ht 67.0 in | Wt 95.0 lb

## 2012-12-03 DIAGNOSIS — M1990 Unspecified osteoarthritis, unspecified site: Secondary | ICD-10-CM

## 2012-12-03 DIAGNOSIS — Z7901 Long term (current) use of anticoagulants: Secondary | ICD-10-CM | POA: Insufficient documentation

## 2012-12-03 DIAGNOSIS — E039 Hypothyroidism, unspecified: Secondary | ICD-10-CM | POA: Insufficient documentation

## 2012-12-03 DIAGNOSIS — R5383 Other fatigue: Secondary | ICD-10-CM

## 2012-12-03 DIAGNOSIS — Z23 Encounter for immunization: Secondary | ICD-10-CM

## 2012-12-03 DIAGNOSIS — R5381 Other malaise: Secondary | ICD-10-CM

## 2012-12-03 DIAGNOSIS — J309 Allergic rhinitis, unspecified: Secondary | ICD-10-CM

## 2012-12-03 DIAGNOSIS — J449 Chronic obstructive pulmonary disease, unspecified: Secondary | ICD-10-CM

## 2012-12-03 DIAGNOSIS — E785 Hyperlipidemia, unspecified: Secondary | ICD-10-CM

## 2012-12-03 DIAGNOSIS — Z125 Encounter for screening for malignant neoplasm of prostate: Secondary | ICD-10-CM

## 2012-12-03 DIAGNOSIS — Z1211 Encounter for screening for malignant neoplasm of colon: Secondary | ICD-10-CM

## 2012-12-03 DIAGNOSIS — M129 Arthropathy, unspecified: Secondary | ICD-10-CM

## 2012-12-03 DIAGNOSIS — R413 Other amnesia: Secondary | ICD-10-CM

## 2012-12-03 LAB — TSH: TSH: 128.9 u[IU]/mL — ABNORMAL HIGH (ref 0.35–5.50)

## 2012-12-03 LAB — BASIC METABOLIC PANEL
BUN: 16 mg/dL (ref 6–23)
CO2: 29 mEq/L (ref 19–32)
Calcium: 9.2 mg/dL (ref 8.4–10.5)
Creatinine, Ser: 0.9 mg/dL (ref 0.4–1.5)
Glucose, Bld: 88 mg/dL (ref 70–99)

## 2012-12-03 LAB — CBC
HCT: 35.7 % — ABNORMAL LOW (ref 39.0–52.0)
Hemoglobin: 11.8 g/dL — ABNORMAL LOW (ref 13.0–17.0)
MCV: 87.6 fl (ref 78.0–100.0)
RBC: 4.08 Mil/uL — ABNORMAL LOW (ref 4.22–5.81)
WBC: 7.7 10*3/uL (ref 4.5–10.5)

## 2012-12-03 LAB — LIPID PANEL
HDL: 44.8 mg/dL (ref >39.00)
Total CHOL/HDL Ratio: 4

## 2012-12-03 LAB — PSA, MEDICARE: PSA: 1 ng/ml (ref 0.10–4.00)

## 2012-12-03 MED ORDER — TRAMADOL HCL 50 MG PO TABS: 50.0000 mg | ORAL_TABLET | Freq: Three times a day (TID) | ORAL | Status: DC | PRN

## 2012-12-03 MED ORDER — LEVOCETIRIZINE DIHYDROCHLORIDE 2.5 MG/5ML PO SOLN: 2.5000 mg | Freq: Every evening | ORAL | Status: DC

## 2012-12-03 NOTE — Assessment & Plan Note (Signed)
eRx for Tramadol, take only when needed No early refills

## 2012-12-03 NOTE — Patient Instructions (Addendum)
Health Maintenance, Males A healthy lifestyle and preventative care can promote health and wellness.  Maintain regular health, dental, and eye exams.  Eat a healthy diet. Foods like vegetables, fruits, whole grains, low-fat dairy products, and lean protein foods contain the nutrients you need without too many calories. Decrease your intake of foods high in solid fats, added sugars, and salt. Get information about a proper diet from your caregiver, if necessary.  Regular physical exercise is one of the most important things you can do for your health. Most adults should get at least 150 minutes of moderate-intensity exercise (any activity that increases your heart rate and causes you to sweat) each week. In addition, most adults need muscle-strengthening exercises on 2 or more days a week.   Maintain a healthy weight. The body mass index (BMI) is a screening tool to identify possible weight problems. It provides an estimate of body fat based on height and weight. Your caregiver can help determine your BMI, and can help you achieve or maintain a healthy weight. For adults 20 years and older:  A BMI below 18.5 is considered underweight.  A BMI of 18.5 to 24.9 is normal.  A BMI of 25 to 29.9 is considered overweight.  A BMI of 30 and above is considered obese.  Maintain normal blood lipids and cholesterol by exercising and minimizing your intake of saturated fat. Eat a balanced diet with plenty of fruits and vegetables. Blood tests for lipids and cholesterol should begin at age 20 and be repeated every 5 years. If your lipid or cholesterol levels are high, you are over 50, or you are a high risk for heart disease, you may need your cholesterol levels checked more frequently.Ongoing high lipid and cholesterol levels should be treated with medicines, if diet and exercise are not effective.  If you smoke, find out from your caregiver how to quit. If you do not use tobacco, do not start.  If you  choose to drink alcohol, do not exceed 2 drinks per day. One drink is considered to be 12 ounces (355 mL) of beer, 5 ounces (148 mL) of wine, or 1.5 ounces (44 mL) of liquor.  Avoid use of street drugs. Do not share needles with anyone. Ask for help if you need support or instructions about stopping the use of drugs.  High blood pressure causes heart disease and increases the risk of stroke. Blood pressure should be checked at least every 1 to 2 years. Ongoing high blood pressure should be treated with medicines if weight loss and exercise are not effective.  If you are 45 to 54 years old, ask your caregiver if you should take aspirin to prevent heart disease.  Diabetes screening involves taking a blood sample to check your fasting blood sugar level. This should be done once every 3 years, after age 45, if you are within normal weight and without risk factors for diabetes. Testing should be considered at a younger age or be carried out more frequently if you are overweight and have at least 1 risk factor for diabetes.  Colorectal cancer can be detected and often prevented. Most routine colorectal cancer screening begins at the age of 50 and continues through age 75. However, your caregiver may recommend screening at an earlier age if you have risk factors for colon cancer. On a yearly basis, your caregiver may provide home test kits to check for hidden blood in the stool. Use of a small camera at the end of a tube,   to directly examine the colon (sigmoidoscopy or colonoscopy), can detect the earliest forms of colorectal cancer. Talk to your caregiver about this at age 50, when routine screening begins. Direct examination of the colon should be repeated every 5 to 10 years through age 75, unless early forms of pre-cancerous polyps or small growths are found.  Hepatitis C blood testing is recommended for all people born from 1945 through 1965 and any individual with known risks for hepatitis C.  Healthy  men should no longer receive prostate-specific antigen (PSA) blood tests as part of routine cancer screening. Consult with your caregiver about prostate cancer screening.  Testicular cancer screening is not recommended for adolescents or adult males who have no symptoms. Screening includes self-exam, caregiver exam, and other screening tests. Consult with your caregiver about any symptoms you have or any concerns you have about testicular cancer.  Practice safe sex. Use condoms and avoid high-risk sexual practices to reduce the spread of sexually transmitted infections (STIs).  Use sunscreen with a sun protection factor (SPF) of 30 or greater. Apply sunscreen liberally and repeatedly throughout the day. You should seek shade when your shadow is shorter than you. Protect yourself by wearing long sleeves, pants, a wide-brimmed hat, and sunglasses year round, whenever you are outdoors.  Notify your caregiver of new moles or changes in moles, especially if there is a change in shape or color. Also notify your caregiver if a mole is larger than the size of a pencil eraser.  A one-time screening for abdominal aortic aneurysm (AAA) and surgical repair of large AAAs by sound wave imaging (ultrasonography) is recommended for ages 65 to 75 years who are current or former smokers.  Stay current with your immunizations. Document Released: 02/08/2008 Document Revised: 11/04/2011 Document Reviewed: 01/07/2011 [employer] Patient Information 2013 [employer].  

## 2012-12-03 NOTE — Assessment & Plan Note (Signed)
Will obtain CT of head May need neurology referral

## 2012-12-03 NOTE — Progress Notes (Signed)
HPI  Pt presents to the clinic today to establish care. He is transferring care from [facility]. He is accompanied by his wife who is the primary historian. She reports that back in 2007 while the patient was in the hospital, he coded after a procedure. He has had difficulty with his short term memory ever since that time but his long term memory has remained intact. He has no family history of dementia in the family. She would like a head CT to make sure there are no degenerative changes noted in his brain that would cause the short term memory loss. In addition, the patient is supposed to be on Coumadin for a mechanical valve. His wife has decided that they are unable to afford copays and transportation fees to get to the clinic to have his INR's checked like he is supposed to, so she does not give him his coumadin. She is giving him 2 81 mg aspirin daily in its place.  Flu: 06/2012 Tetanus: more than 10 years Pneumovax: more than 5 years Colonoscopy: never Eye doctor: never Dentist: no teeth  Past Medical History  Diagnosis Date  . COPD (chronic obstructive pulmonary disease)   . Thyroid disease   . History of heart valve replacement   . Hypothyroid   . Arthritis   . Allergy   . Ulcer   . History of chicken pox     Current Outpatient Prescriptions  Medication Sig Dispense Refill  . albuterol (PROVENTIL) (2.5 MG/3ML) 0.083% nebulizer solution Take 2.5 mg by nebulization every 6 (six) hours as needed. For shortness of breath      . arformoterol (BROVANA) 15 MCG/2ML NEBU Take 15 mcg by nebulization 2 (two) times daily.      Marland Kitchen aspirin 81 MG tablet Take 81 mg by mouth daily.      . budesonide (PULMICORT) 0.25 MG/2ML nebulizer solution Take 0.25 mg by nebulization 2 (two) times daily.      Docia Barrier IN Inhale into the lungs. 2L [facility]      . levothyroxine (SYNTHROID, LEVOTHROID) 75 MCG tablet Take 75 mcg by mouth daily.      Marland Kitchen warfarin (COUMADIN) 2 MG tablet  Take 2 mg by mouth daily.       No current facility-administered medications for this visit.    Allergies  Allergen Reactions  . Penicillins Nausea And Vomiting    Family History  Problem Relation Age of Onset  . Heart attack Father     History   Social History  . Marital Status: Married    Spouse Name: N/A    Number of Children: N/A  . Years of Education: 9   Occupational History  . Disabled    Social History Main Topics  . Smoking status: Current Every Day Smoker -- 1.00 packs/day    Types: Cigarettes    Last Attempt to Quit: 07/27/2012  . Smokeless tobacco: Never Used     Comment: Smoking cessation education  . Alcohol Use: No     Comment: 1-2 per week  . Drug Use: No  . Sexually Active: Not on file   Other Topics Concern  . Not on file   Social History Narrative   Regular exercise-no   Caffeine Use-yes    ROS:  Constitutional: Denies fever, malaise, fatigue, headache or abrupt weight changes.  HEENT: Pt reports blurred vision. Denies eye pain, eye redness, ear pain, ringing in the ears, wax buildup, runny nose, nasal congestion, bloody nose, or  sore throat. Respiratory: Pt reports occasionally difficulty breathing d/t COPD. Continues to smoke daily. Denies shortness of breath, cough or sputum production.   Cardiovascular: Denies chest pain, chest tightness, palpitations or swelling in the hands or feet.  Gastrointestinal: Denies abdominal pain, bloating, constipation, diarrhea or blood in the stool.  GU: Pt reports increased amount of urine.  Denies frequency, urgency, pain with urination, blood in urine, odor or discharge. Musculoskeletal: Denies decrease in range of motion, difficulty with gait, muscle pain or joint pain and swelling.  Skin: Denies redness, rashes, lesions or ulcercations.  Neurological: Denies dizziness, difficulty with memory, difficulty with speech or problems with balance and coordination.   No other specific complaints in a complete  review of systems (except as listed in HPI above).  PE:  BP 102/60  Pulse 78  Temp(Src) 97.9 F (36.6 C) (Oral)  Ht 5\' 7"  (1.702 m)  Wt 95 lb (43.092 kg)  BMI 14.88 kg/m2  SpO2 98% Wt Readings from Last 3 Encounters:  12/03/12 95 lb (43.092 kg)  11/02/12 96 lb 12.8 oz (43.908 kg)  09/28/12 97 lb 6.4 oz (44.18 kg)    General: Appears his stated age, very underweight in NAD. HEENT: Head: normal shape and size; Eyes: sclera white, no icterus, conjunctiva pink, PERRLA and EOMs intact; Ears: Tm's gray and intact, normal light reflex; Nose: mucosa pink and moist, septum midline; Throat/Mouth: Teeth absent mucosa pink and moist, no lesions or ulcerations noted.  Neck: Normal range of motion. Neck supple, trachea midline. No massses, lumps or thyromegaly present.  Cardiovascular: Normal rate and rhythm. S1,S2 noted.  Click noted, No rubs or gallops noted. No JVD or BLE edema. No carotid bruits noted. Pulmonary/Chest: Normal effort and positive vesicular breath sounds. No respiratory distress. No wheezes, rales or ronchi noted.  Abdomen: Soft and nontender. Normal bowel sounds, no bruits noted. No distention or masses noted. Liver, spleen and kidneys non palpable. Musculoskeletal:  Moderately kyphotic. Normal range of motion. No signs of joint swelling. No difficulty with gait.  Neurological: Alert and oriented. Cranial nerves II-XII intact. Coordination normal. +DTRs bilaterally. Impaired short term memory by MMSE. Psychiatric: Mood flat and affect normal. Behavior is normal. Judgment and thought content normal.     Assessment and Plan:  Prevent Health maintenance:  Smoking Cessation counseling given, approx 5 minutes, education materials given, pt not ready to quit at this time TDAP and pneumovax given today Will set up colonoscopy Encouraged pt to visit eye doctor at least yearly Encouraged pt to eat foods that would aid him to gain weight Will obtain basic screening labs today-  PSA

## 2012-12-03 NOTE — Assessment & Plan Note (Signed)
eRx for Xyzal Avoid known triggers

## 2012-12-03 NOTE — Assessment & Plan Note (Signed)
Continue inhalers Encouraged pt to quit smoking

## 2012-12-03 NOTE — Assessment & Plan Note (Signed)
Will check TSH today Will titrate meds based on levels

## 2012-12-03 NOTE — Assessment & Plan Note (Signed)
Lengthy discussion with pt and wife about risk of not complying with coumadin with mechanical valve. Wife understands and restates that she is unable to pay copays and pay for transportation for frequent coumadin clinic visits. That is why she has the patient on 2 aspirins per day. Discussed with pt and wife about how the medications work differently. Wife understands and is against putting pt back on coumadin. Would like to continue aspirin at this time.  I plan to call the home health agency to see if they can check his INR at home and call us for medication management.

## 2012-12-04 ENCOUNTER — Telehealth: Payer: Self-pay | Admitting: Internal Medicine

## 2012-12-04 ENCOUNTER — Encounter: Payer: Self-pay | Admitting: Gastroenterology

## 2012-12-04 ENCOUNTER — Telehealth: Payer: Self-pay | Admitting: General Practice

## 2012-12-04 ENCOUNTER — Other Ambulatory Visit: Payer: Self-pay | Admitting: Internal Medicine

## 2012-12-04 ENCOUNTER — Other Ambulatory Visit: Payer: Medicare Other

## 2012-12-04 ENCOUNTER — Other Ambulatory Visit: Payer: Self-pay | Admitting: General Practice

## 2012-12-04 ENCOUNTER — Other Ambulatory Visit: Payer: Self-pay | Admitting: *Deleted

## 2012-12-04 DIAGNOSIS — E039 Hypothyroidism, unspecified: Secondary | ICD-10-CM

## 2012-12-04 DIAGNOSIS — R05 Cough: Secondary | ICD-10-CM

## 2012-12-04 MED ORDER — LEVOTHYROXINE SODIUM 75 MCG PO TABS: 75.0000 ug | ORAL_TABLET | Freq: Every day | ORAL | Status: DC

## 2012-12-04 MED ORDER — WARFARIN SODIUM 2 MG PO TABS: 2.0000 mg | ORAL_TABLET | Freq: Every day | ORAL | Status: DC

## 2012-12-04 MED ORDER — LEVOCETIRIZINE DIHYDROCHLORIDE 5 MG PO TABS: 2.5000 mg | ORAL_TABLET | Freq: Every evening | ORAL | Status: DC

## 2012-12-04 NOTE — Telephone Encounter (Signed)
Stanley Rodriguez, The order has been changed to tablets and sent to [facility] [address]

## 2012-12-04 NOTE — Telephone Encounter (Signed)
Pt was seen yesterday.  The Levocetirizine was prescribed in liquid form.  It would cost them $2200.00.  If it can be prescribed in tablet form and sent to [facility] it would not cost them anything.

## 2012-12-04 NOTE — Telephone Encounter (Signed)
Spoke with patient's wife and gave dosing instructions for coumadin.  Patient's INR to be checked in 1 week.

## 2012-12-08 ENCOUNTER — Ambulatory Visit (INDEPENDENT_AMBULATORY_CARE_PROVIDER_SITE_OTHER)
Admission: RE | Admit: 2012-12-08 | Discharge: 2012-12-08 | Disposition: A | Discharge status: Home / Self Care | Payer: Medicare HMO | Source: Ambulatory Visit | Attending: Internal Medicine | Admitting: Internal Medicine

## 2012-12-08 DIAGNOSIS — R413 Other amnesia: Secondary | ICD-10-CM

## 2012-12-15 ENCOUNTER — Other Ambulatory Visit: Payer: Self-pay | Admitting: Internal Medicine

## 2012-12-16 ENCOUNTER — Telehealth: Payer: Self-pay | Admitting: Internal Medicine

## 2012-12-16 NOTE — Telephone Encounter (Signed)
Spoke with Christy(wife of patient) she states that the company based out of [place] will no longer be able to supply him his medication; he will need new Rx's sent to [place] in order for his insurance to cover. Neysa Bonito stated she had to send the nebulizer machine back to [facility] but a friend of his gave him a machine so no Rx needed for nebulizer machine. Christy aware that I will have CY sign Rx's tomorrow morning and fax/send order to [facility] through our PCC's.

## 2012-12-17 ENCOUNTER — Other Ambulatory Visit: Payer: Self-pay | Admitting: Internal Medicine

## 2012-12-17 DIAGNOSIS — J449 Chronic obstructive pulmonary disease, unspecified: Secondary | ICD-10-CM

## 2012-12-17 MED ORDER — BUDESONIDE 0.25 MG/2ML IN SUSP: 0.2500 mg | Freq: Two times a day (BID) | RESPIRATORY_TRACT | Status: AC

## 2012-12-17 MED ORDER — ARFORMOTEROL TARTRATE 15 MCG/2ML IN NEBU: 15.0000 ug | INHALATION_SOLUTION | Freq: Two times a day (BID) | RESPIRATORY_TRACT | Status: AC

## 2012-12-17 NOTE — Telephone Encounter (Signed)
Rx's have been faxed to [facility].

## 2012-12-18 ENCOUNTER — Encounter: Payer: Self-pay | Admitting: Nurse Practitioner

## 2012-12-18 ENCOUNTER — Ambulatory Visit (INDEPENDENT_AMBULATORY_CARE_PROVIDER_SITE_OTHER): Payer: Medicare HMO | Admitting: Nurse Practitioner

## 2012-12-18 ENCOUNTER — Ambulatory Visit (INDEPENDENT_AMBULATORY_CARE_PROVIDER_SITE_OTHER): Payer: Medicare HMO | Admitting: General Practice

## 2012-12-18 ENCOUNTER — Other Ambulatory Visit: Payer: Self-pay | Admitting: Internal Medicine

## 2012-12-18 VITALS — BP 100/60 | HR 66 | Ht 67.0 in | Wt 93.8 lb

## 2012-12-18 DIAGNOSIS — Z954 Presence of other heart-valve replacement: Secondary | ICD-10-CM

## 2012-12-18 DIAGNOSIS — Z1211 Encounter for screening for malignant neoplasm of colon: Secondary | ICD-10-CM

## 2012-12-18 DIAGNOSIS — Z7901 Long term (current) use of anticoagulants: Secondary | ICD-10-CM

## 2012-12-18 DIAGNOSIS — J449 Chronic obstructive pulmonary disease, unspecified: Secondary | ICD-10-CM

## 2012-12-18 DIAGNOSIS — Z952 Presence of prosthetic heart valve: Secondary | ICD-10-CM

## 2012-12-18 LAB — POCT INR: INR: 3.3

## 2012-12-18 NOTE — Progress Notes (Signed)
HPI :   Patient is a 52 year old male with a history of mechanical valve for which he is on chronic Coumadin. He has COPD on oxygen at night at home. Patient continues to smoke. He is referred for colon cancer screening. Patient was originally scheduled for a direct colonoscopy with Dr. Christella Hartigan. After discovering the patient uses oxygen at home and is on Coumadin, he was scheduled to see me decide on whether he was a candidate for colon cancer screening.   Patient has occasional constipation, no other bowel problems. Denies rectal bleeding. No abdominal pain. No nausea or vomiting. No drastic weight loss. No family history colon cancer. He has mild chronic normocytic anemia. Hemoglobin earlier this month was 11.8. Patient has hypothyroidism, TSH this month was remarkedly elevated at 128  Past Medical History  Diagnosis Date  . COPD (chronic obstructive pulmonary disease)   . Thyroid disease   . History of heart valve replacement   . Hypothyroid   . Arthritis   . Allergy   . Ulcer   . History of chicken pox     Family History  Problem Relation Age of Onset  . Heart attack Father   . Colon cancer Neg Hx    History  Substance Use Topics  . Smoking status: Current Every Day Smoker -- 1.00 packs/day for 25 years    Types: Cigarettes    Last Attempt to Quit: 07/27/2012  . Smokeless tobacco: Former Neurosurgeon    Types: Chew     Comment: Tobacco info given 12/18/12  . Alcohol Use: No     Comment: 1-2 per week   Current Outpatient Prescriptions  Medication Sig Dispense Refill  . arformoterol (BROVANA) 15 MCG/2ML NEBU Take 2 mLs (15 mcg total) by nebulization 2 (two) times daily.  120 mL  6  . budesonide (PULMICORT) 0.25 MG/2ML nebulizer solution Take 2 mLs (0.25 mg total) by nebulization 2 (two) times daily.  120 mL  6  . levocetirizine (XYZAL) 5 MG tablet TAKE 1/2  TABLET  (2.5 MG) EVERY EVENING.  45 tablet  PRN  . levothyroxine (SYNTHROID, LEVOTHROID) 75 MCG tablet TAKE 1 TABLET EVERY DAY   30 tablet  PRN  . OXYGEN-HELIUM IN Inhale into the lungs. 2L [facility]      . traMADol (ULTRAM) 50 MG tablet TAKE 1 TABLET EVERY 8 HOURS AS NEEDED  FOR  PAIN  30 tablet  PRN  . warfarin (COUMADIN) 2 MG tablet TAKE 1 TABLET ONE TIME DAILY AS DIRECTED  BY  COUMADIN  CLINIC  120 tablet  PRN   No current facility-administered medications for this visit.   Allergies  Allergen Reactions  . Penicillins Nausea And Vomiting   Review of Systems: Positive for allergies, arthritis, back pain, vision changes, confusion, cough, coughing up blood, fatigue, hearing problems, itching, muscle pains, shortness of breath, sleeping problems, swelling of feet, excessive thirst and excessive urination. All other systems reviewed and negative except where noted in HPI.    Physical Exam: BP 100/60  Pulse 66  Ht 5\' 7"  (1.702 m)  Wt 93 lb 12.8 oz (42.547 kg)  BMI 14.69 kg/m2 Constitutional: Thin, white male in no acute distress. HEENT: Normocephalic and atraumatic. Conjunctivae are normal. No scleral icterus. Neck supple.  Cardiovascular: Normal rate, regular rhythm. Mechanical valve click Pulmonary/chest: Respirations even and unlabored. Decreased breath sounds at both bases Abdominal: Soft, nondistended, nontender. Bowel sounds active throughout. There are no masses palpable. No hepatomegaly. Extremities: no edema Lymphadenopathy: No cervical  adenopathy noted. Neurological: Alert and oriented to person place and time. Skin: Skin is warm and dry. No rashes noted. Psychiatric: Quiet   ASSESSMENT AND PLAN:   25. 52 year old male here for colon cancer screening. No family history of colon cancer. No bowel changes. No rectal bleeding. No unusual weight loss. Patient has COPD, uses oxygen at night at home. He is on chronic Coumadin for valve replacement. Patient is at high risk for procedures. He will be scheduled for a virtual colonoscopy for colon cancer screening. If abnormal, patient will likely  need to proceed with a colonoscopy which will require discontinuation of Coumadin with probable Lovenox bridging.  2. mechanical valve, on chronic Coumadin.  3. COPD, ongoing tobacco use.  4. hypothyroidism, markedly elevated TSH of 128 recently. PCP managing.

## 2012-12-18 NOTE — Patient Instructions (Addendum)
We are scheduling a Virtual colonoscopy.  You should get a call from [facility], their number is [phone]. We are getting this precertified with [facility] Medicare.  If they approve it, then [facility] will call you with the appointment and instructions for the prep for the colonoscopy.

## 2012-12-18 NOTE — Progress Notes (Signed)
As discussed with extender. The patient is too complicated and too high risk for routine screening colonoscopy. As an alternative, virtual colonoscopy to exclude colorectal neoplasia.

## 2012-12-23 ENCOUNTER — Telehealth: Payer: Self-pay | Admitting: Internal Medicine

## 2012-12-23 NOTE — Telephone Encounter (Signed)
Spoke with [employer]-need to call [employer] at [phone] to get PA for [facility]; Called and started PA-they are faxing forms to [place] for [place] to fill out fax back-turn around time for approval/denial is 24 to 72 hours.

## 2012-12-28 ENCOUNTER — Other Ambulatory Visit: Payer: Medicare HMO

## 2012-12-28 ENCOUNTER — Telehealth: Payer: Self-pay | Admitting: *Deleted

## 2012-12-28 NOTE — Telephone Encounter (Signed)
Received a call from [facility] that patient did not show for virtual colonoscopy.

## 2012-12-28 NOTE — Telephone Encounter (Signed)
Pt's mother called in & would like a status update & can be reached at [phone].  Stanley Rodriguez

## 2012-12-28 NOTE — Telephone Encounter (Signed)
PA paperwork has been faxed with STAT request. Had to wait for insurance company to fax the paperwork. Will await a decision.

## 2012-12-29 NOTE — Telephone Encounter (Signed)
Call patient and see what happened. Reschedule if he is agreeable. Thanks

## 2012-12-29 NOTE — Telephone Encounter (Signed)
Nurse to call pt

## 2012-12-30 NOTE — Telephone Encounter (Signed)
Spoke with patient's wife-aware that I have faxed PA information back to insurance company-she then stated that the insurance company([employer]) told her that they had no PA on file/pending for patient. I called [employer] PA line and was told that it was approved from 12-28-12 through 12-29-14 PA # [id number] and if Stanley Rodriguez had any troubles with Rx going through then call [facility] and they would help.   Stanley Rodriguez is aware of PA approval and states patient should have his [place] Rx tomorrow. Wife of patient is aware as well. Nothing more needed at this time.

## 2012-12-30 NOTE — Telephone Encounter (Signed)
States he did not have transportation to make it to the appt. States he is not sure if he wants to reschedule. Pt states he will call back and let us know.  01/01/13@1550  Pt states he does not want to do anything at this time, does not want to reschedule. Dr. Marina Goodell notified.

## 2013-01-01 NOTE — Telephone Encounter (Signed)
Noted. Thank you for calling him.

## 2013-01-03 ENCOUNTER — Emergency Department (HOSPITAL_COMMUNITY)
Admission: EM | Admit: 2013-01-03 | Discharge: 2013-01-03 | Disposition: A | Discharge status: Home / Self Care | Payer: Medicare HMO | Attending: Emergency Medicine | Admitting: Emergency Medicine

## 2013-01-03 ENCOUNTER — Encounter (HOSPITAL_COMMUNITY): Payer: Self-pay | Admitting: Emergency Medicine

## 2013-01-03 DIAGNOSIS — Z79899 Other long term (current) drug therapy: Secondary | ICD-10-CM | POA: Insufficient documentation

## 2013-01-03 DIAGNOSIS — J449 Chronic obstructive pulmonary disease, unspecified: Secondary | ICD-10-CM | POA: Insufficient documentation

## 2013-01-03 DIAGNOSIS — Z954 Presence of other heart-valve replacement: Secondary | ICD-10-CM | POA: Insufficient documentation

## 2013-01-03 DIAGNOSIS — Z872 Personal history of diseases of the skin and subcutaneous tissue: Secondary | ICD-10-CM | POA: Insufficient documentation

## 2013-01-03 DIAGNOSIS — Z8619 Personal history of other infectious and parasitic diseases: Secondary | ICD-10-CM | POA: Insufficient documentation

## 2013-01-03 DIAGNOSIS — J4489 Other specified chronic obstructive pulmonary disease: Secondary | ICD-10-CM | POA: Insufficient documentation

## 2013-01-03 DIAGNOSIS — Z9889 Other specified postprocedural states: Secondary | ICD-10-CM | POA: Insufficient documentation

## 2013-01-03 DIAGNOSIS — F17200 Nicotine dependence, unspecified, uncomplicated: Secondary | ICD-10-CM | POA: Insufficient documentation

## 2013-01-03 DIAGNOSIS — R531 Weakness: Secondary | ICD-10-CM

## 2013-01-03 DIAGNOSIS — R5383 Other fatigue: Secondary | ICD-10-CM | POA: Insufficient documentation

## 2013-01-03 DIAGNOSIS — R5381 Other malaise: Secondary | ICD-10-CM | POA: Insufficient documentation

## 2013-01-03 DIAGNOSIS — Z8739 Personal history of other diseases of the musculoskeletal system and connective tissue: Secondary | ICD-10-CM | POA: Insufficient documentation

## 2013-01-03 DIAGNOSIS — E039 Hypothyroidism, unspecified: Secondary | ICD-10-CM | POA: Insufficient documentation

## 2013-01-03 DIAGNOSIS — Z952 Presence of prosthetic heart valve: Secondary | ICD-10-CM

## 2013-01-03 DIAGNOSIS — D689 Coagulation defect, unspecified: Secondary | ICD-10-CM | POA: Insufficient documentation

## 2013-01-03 LAB — CBC
MCH: 28.7 pg (ref 26.0–34.0)
Platelets: 366 10*3/uL (ref 150–400)
RBC: 3.87 MIL/uL — ABNORMAL LOW (ref 4.22–5.81)

## 2013-01-03 LAB — PROTIME-INR
INR: 3.61 — ABNORMAL HIGH (ref 0.00–1.49)
Prothrombin Time: 33.9 seconds — ABNORMAL HIGH (ref 11.6–15.2)

## 2013-01-03 LAB — COMPREHENSIVE METABOLIC PANEL
ALT: 5 U/L (ref 0–53)
AST: 10 U/L (ref 0–37)
Albumin: 3.2 g/dL — ABNORMAL LOW (ref 3.5–5.2)
Alkaline Phosphatase: 84 U/L (ref 39–117)
BUN: 14 mg/dL (ref 6–23)
CO2: 30 mEq/L (ref 19–32)
Calcium: 9.3 mg/dL (ref 8.4–10.5)
Chloride: 102 mEq/L (ref 96–112)
Creatinine, Ser: 0.86 mg/dL (ref 0.50–1.35)
GFR calc Af Amer: >90 mL/min (ref >90)
GFR calc non Af Amer: >90 mL/min (ref >90)
Glucose, Bld: 93 mg/dL (ref 70–99)
Potassium: 3.7 mEq/L (ref 3.5–5.1)
Sodium: 139 mEq/L (ref 135–145)
Total Bilirubin: 0.2 mg/dL — ABNORMAL LOW (ref 0.3–1.2)
Total Protein: 7.4 g/dL (ref 6.0–8.3)

## 2013-01-03 LAB — DIFFERENTIAL
Basophils Relative: 0 % (ref 0–1)
Eosinophils Absolute: 0.1 10*3/uL (ref 0.0–0.7)
Lymphs Abs: 1.2 10*3/uL (ref 0.7–4.0)
Neutrophils Relative %: 77 % (ref 43–77)

## 2013-01-03 LAB — POCT I-STAT TROPONIN I
Troponin i, poc: 0 ng/mL (ref 0.00–0.08)
Troponin i, poc: 0 ng/mL (ref 0.00–0.08)
Troponin i, poc: 0 ng/mL (ref 0.00–0.08)

## 2013-01-03 LAB — GLUCOSE, CAPILLARY: Glucose-Capillary: 81 mg/dL (ref 70–99)

## 2013-01-03 LAB — TROPONIN I: Troponin I: <0.3 ng/mL (ref <0.30)

## 2013-01-03 NOTE — ED Provider Notes (Signed)
History     CSN: [id number]  Arrival date & time 01/03/13  1455   First MD Initiated Contact with Patient 01/03/13 1503      Chief Complaint  Patient presents with  . Weakness    (Consider location/radiation/quality/duration/timing/severity/associated sxs/prior treatment) HPI This 52 year old male was last known well at 2:30 this afternoon when sometime between 2:30 3:00 this afternoon he had sudden onset of a generalized weakness feeling with some lightheadedness with sweating and possibly might have had transient vertigo although that is uncertain, he is no headache no confusion no change in speech vision swallowing or understanding no focal or lateralizing weakness numbness or incoordination he had no chest pain no cough no shortness breath no abdominal pain no nausea but he was sweaty and now is asymptomatic spell lasted about half an hour before resolving completely. There is no treatment prior to arrival. Past Medical History  Diagnosis Date  . COPD (chronic obstructive pulmonary disease)   . Thyroid disease   . History of heart valve replacement   . Hypothyroid   . Arthritis   . Allergy   . Ulcer   . History of chicken pox     Past Surgical History  Procedure Laterality Date  . Cardiac surgery    . Lung surgery  03/1999  . Cardiac valve surgery  10/1998    metal valve  . Lung biopsy  01/2010  . Aorta surgery      Family History  Problem Relation Age of Onset  . Heart attack Father   . Colon cancer Neg Hx     History  Substance Use Topics  . Smoking status: Current Every Day Smoker -- 1.00 packs/day for 25 years    Types: Cigarettes    Last Attempt to Quit: 07/27/2012  . Smokeless tobacco: Former Neurosurgeon    Types: Chew     Comment: Tobacco info given 12/18/12  . Alcohol Use: No     Comment: 1-2 per week      Review of Systems 10 Systems reviewed and are negative for acute change except as noted in the HPI. Allergies  Penicillins  Home Medications    Current Outpatient Rx  Name  Route  Sig  Dispense  Refill  . arformoterol (BROVANA) 15 MCG/2ML NEBU   Nebulization   Take 2 mLs (15 mcg total) by nebulization 2 (two) times daily.   120 mL   6     DX 493.20   . budesonide (PULMICORT) 0.25 MG/2ML nebulizer solution   Nebulization   Take 2 mLs (0.25 mg total) by nebulization 2 (two) times daily.   120 mL   6     DX 493.20   . levocetirizine (XYZAL) 5 MG tablet      TAKE 1/2  TABLET  (2.5 MG) EVERY EVENING.   45 tablet   PRN   . levothyroxine (SYNTHROID, LEVOTHROID) 75 MCG tablet      TAKE 1 TABLET EVERY DAY   30 tablet   PRN   . OXYGEN-HELIUM IN   Inhalation   Inhale into the lungs. 2L [facility]         . traMADol (ULTRAM) 50 MG tablet      TAKE 1 TABLET EVERY 8 HOURS AS NEEDED  FOR  PAIN   30 tablet   PRN   . warfarin (COUMADIN) 2 MG tablet      TAKE 1 TABLET ONE TIME DAILY AS DIRECTED  BY  COUMADIN  CLINIC  120 tablet   PRN     BP 94/67  Pulse 56  Temp(Src) 98.4 F (36.9 C) (Oral)  Resp 19  SpO2 100%  Physical Exam  Nursing note and vitals reviewed. Constitutional:  Awake, alert, nontoxic appearance with baseline speech for patient.  HENT:  Head: Atraumatic.  Mouth/Throat: No oropharyngeal exudate.  Eyes: EOM are normal. Pupils are equal, round, and reactive to light. Right eye exhibits no discharge. Left eye exhibits no discharge.  Neck: Neck supple.  Cardiovascular: Normal rate and regular rhythm.   No murmur heard. Pulmonary/Chest: Effort normal and breath sounds normal. No stridor. No respiratory distress. He has no wheezes. He has no rales. He exhibits no tenderness.  Abdominal: Soft. Bowel sounds are normal. He exhibits no mass. There is no tenderness. There is no rebound.  Musculoskeletal: He exhibits no tenderness.  Baseline ROM, moves extremities with no obvious new focal weakness.  Lymphadenopathy:    He has no cervical adenopathy.  Neurological: He is alert.   Awake, alert, cooperative and aware of situation; motor strength bilaterally; sensation normal to light touch bilaterally; peripheral visual fields full to confrontation; no facial asymmetry; tongue midline; major cranial nerves appear intact; no pronator drift, normal finger to nose bilaterally, baseline gait without new ataxia.  Skin: No rash noted.  Psychiatric: He has a normal mood and affect.    ED Course  Procedures (including critical care time) ECG: Sinus rhythm, ventricular rate 78, normal axis, left ventricular hypertrophy, diffuse ST segment elevation, no significant change noted compared with February 2013  Patient / Family / Caregiver understand and agree with initial ED impression and plan with expectations set for ED visit. Pt stable in ED with no significant deterioration in condition. Await 6 hour troponin then anticipate discharge Sxs not suggestive of SAH, CVA, also doubt sepsis or ACS. Pt's goal of INR is 2.5-3.5 for mechanical valve replacement. Care endorsed to PA. Labs Reviewed  PROTIME-INR - Abnormal; Notable for the following:    Prothrombin Time 33.9 (*)    INR 3.61 (*)    All other components within normal limits  APTT - Abnormal; Notable for the following:    aPTT 71 (*)    All other components within normal limits  CBC - Abnormal; Notable for the following:    RBC 3.87 (*)    Hemoglobin 11.1 (*)    HCT 33.6 (*)    All other components within normal limits  COMPREHENSIVE METABOLIC PANEL - Abnormal; Notable for the following:    Albumin 3.2 (*)    Total Bilirubin 0.2 (*)    All other components within normal limits  ETHANOL  DIFFERENTIAL  TROPONIN I  GLUCOSE, CAPILLARY  CG4 I-STAT (LACTIC ACID)  POCT I-STAT TROPONIN I  POCT I-STAT TROPONIN I  POCT I-STAT TROPONIN I   No results found.   1. Weakness   2. H/O aortic valve replacement   3. Warfarin-induced coagulopathy, initial encounter       MDM  Dispo pending anticipate discharge.  2000        Hurman Horn, MD 01/18/13 [phone]

## 2013-01-03 NOTE — ED Provider Notes (Signed)
Stanley Rodriguez S 8:00 PM patient discussed in sign out with Dr. Fonnie Jarvis. Patient with nonspecific symptoms awake being third troponin check at 9:30. Workup thus far unremarkable for any concerning her life threatening emergency. If his third troponin is negative plan to discharge the patient with close PCP followup.  9:20 PM patient and family very upset with the length of stay in the emergency department. They feel process is taking too long and wish to return home.  I was able to talk with the patient and his wife is to call his wife down. They are now agreeable to wait for the third troponin test. Nurse is drawing blood now (9:37pm).    9:52 PM third troponin is negative. Will discharge patient at this time.  Angus Seller, PA-C 01/03/13 2154

## 2013-01-03 NOTE — ED Notes (Signed)
Pt reports pt was sitting on couch watching TV experienced sudden onset of generalized weakness with diaphoresis and dizziness. Pt denies pain. No slurred speech per EMS. Last seen normal by wife immediately prior to event.

## 2013-01-04 ENCOUNTER — Ambulatory Visit: Payer: Medicare Other | Admitting: Internal Medicine

## 2013-01-04 NOTE — ED Provider Notes (Signed)
Medical screening examination/treatment/procedure(s) were performed by non-physician practitioner and as supervising physician I was immediately available for consultation/collaboration.  Shareta Fishbaugh, MD 01/04/13 0035 

## 2013-01-20 ENCOUNTER — Encounter: Payer: Self-pay | Admitting: Internal Medicine

## 2013-01-20 ENCOUNTER — Other Ambulatory Visit (INDEPENDENT_AMBULATORY_CARE_PROVIDER_SITE_OTHER): Payer: Medicare HMO

## 2013-01-20 ENCOUNTER — Ambulatory Visit (INDEPENDENT_AMBULATORY_CARE_PROVIDER_SITE_OTHER): Payer: Medicare HMO | Admitting: Internal Medicine

## 2013-01-20 ENCOUNTER — Ambulatory Visit (INDEPENDENT_AMBULATORY_CARE_PROVIDER_SITE_OTHER): Payer: Medicare HMO | Admitting: Family Medicine

## 2013-01-20 VITALS — BP 92/62 | HR 123 | Temp 98.5°F | Ht 67.0 in | Wt 85.5 lb

## 2013-01-20 DIAGNOSIS — M129 Arthropathy, unspecified: Secondary | ICD-10-CM

## 2013-01-20 DIAGNOSIS — Z7901 Long term (current) use of anticoagulants: Secondary | ICD-10-CM

## 2013-01-20 DIAGNOSIS — Z952 Presence of prosthetic heart valve: Secondary | ICD-10-CM

## 2013-01-20 DIAGNOSIS — M1990 Unspecified osteoarthritis, unspecified site: Secondary | ICD-10-CM

## 2013-01-20 DIAGNOSIS — E039 Hypothyroidism, unspecified: Secondary | ICD-10-CM

## 2013-01-20 DIAGNOSIS — E46 Unspecified protein-calorie malnutrition: Secondary | ICD-10-CM

## 2013-01-20 DIAGNOSIS — J309 Allergic rhinitis, unspecified: Secondary | ICD-10-CM

## 2013-01-20 DIAGNOSIS — Z954 Presence of other heart-valve replacement: Secondary | ICD-10-CM

## 2013-01-20 MED ORDER — METHYLPREDNISOLONE ACETATE 80 MG/ML IJ SUSP
80.0000 mg | Freq: Once | INTRAMUSCULAR | Status: AC
  Administered 2013-01-20: 80 mg via INTRAMUSCULAR

## 2013-01-20 MED ORDER — HYDROCODONE-ACETAMINOPHEN 10-325 MG PO TABS: 1.0000 | ORAL_TABLET | Freq: Three times a day (TID) | ORAL | Status: DC | PRN

## 2013-01-20 MED ORDER — MEGESTROL ACETATE 20 MG PO TABS: 20.0000 mg | ORAL_TABLET | Freq: Two times a day (BID) | ORAL | Status: AC

## 2013-01-20 NOTE — Assessment & Plan Note (Signed)
Monitor weight Start megace

## 2013-01-20 NOTE — Patient Instructions (Signed)

## 2013-01-20 NOTE — Assessment & Plan Note (Signed)
Switch tramadol to Lortab, #30, 0 early refills

## 2013-01-20 NOTE — Assessment & Plan Note (Signed)
Will recheck TSH today Will refill meds based on level

## 2013-01-20 NOTE — Assessment & Plan Note (Signed)
Xyzal not effective Will give 80 mg Depo IM today

## 2013-01-20 NOTE — Progress Notes (Signed)
Subjective:    Patient ID: Stanley Rodriguez, male    DOB: 17-Sep-1960, 52 y.o.   MRN: [id number]  HPI  Pt presents to the clinic today to f/u hypthyroidism. At his last visit, his TSH was 132. He reports that he had not been taking his medication as prescribed. It was refilled and he is here to have his level recheck today. He has not had any problems since his last visit. He does have some concerns about poor appetite. He used to be on Megace. He just doesn't not want to eat. He is losing weight. He would like his megace refilled today. Also, he c/o arthritis pain in his knees. The tramadol is not working. He used ot be on Lortab which worked well for him.  Review of Systems      Past Medical History  Diagnosis Date  . COPD (chronic obstructive pulmonary disease)   . Thyroid disease   . History of heart valve replacement   . Hypothyroid   . Arthritis   . Allergy   . Ulcer   . History of chicken pox     Current Outpatient Prescriptions  Medication Sig Dispense Refill  . arformoterol (BROVANA) 15 MCG/2ML NEBU Take 2 mLs (15 mcg total) by nebulization 2 (two) times daily.  120 mL  6  . budesonide (PULMICORT) 0.25 MG/2ML nebulizer solution Take 2 mLs (0.25 mg total) by nebulization 2 (two) times daily.  120 mL  6  . levocetirizine (XYZAL) 5 MG tablet TAKE 1/2  TABLET  (2.5 MG) EVERY EVENING.  45 tablet  PRN  . levothyroxine (SYNTHROID, LEVOTHROID) 75 MCG tablet TAKE 1 TABLET EVERY DAY  30 tablet  PRN  . OXYGEN-HELIUM IN Inhale into the lungs. 2L [facility]      . traMADol (ULTRAM) 50 MG tablet TAKE 1 TABLET EVERY 8 HOURS AS NEEDED  FOR  PAIN  30 tablet  PRN  . warfarin (COUMADIN) 2 MG tablet TAKE 1 TABLET ONE TIME DAILY AS DIRECTED  BY  COUMADIN  CLINIC  120 tablet  PRN   No current facility-administered medications for this visit.    Allergies  Allergen Reactions  . Penicillins Nausea And Vomiting    Family History  Problem Relation Age of Onset  . Heart attack  Father   . Colon cancer Neg Hx     History   Social History  . Marital Status: Married    Spouse Name: Neysa Bonito    Number of Children: 0  . Years of Education: 9   Occupational History  . Disabled    Social History Main Topics  . Smoking status: Current Every Day Smoker -- 1.00 packs/day for 25 years    Types: Cigarettes    Last Attempt to Quit: 07/27/2012  . Smokeless tobacco: Former Neurosurgeon    Types: Chew     Comment: Tobacco info given 12/18/12  . Alcohol Use: No     Comment: 1-2 per week  . Drug Use: No  . Sexually Active: Not on file   Other Topics Concern  . Not on file   Social History Narrative   Regular exercise-no   Caffeine Use-yes     Constitutional: Pt reports loss of appetite and weight loss. Denies fever, malaise, fatigue, headache or. Respiratory: Denies difficulty breathing, shortness of breath, cough or sputum production.   Cardiovascular: Denies chest pain, chest tightness, palpitations or swelling in the hands or feet.  Gastrointestinal: Denies abdominal pain, bloating, constipation, diarrhea or  blood in the stool.  Musculoskeletal: Pt reports knee pain. Denies decrease in range of motion, difficulty with gait, muscle pain.  Skin: Denies redness, rashes, lesions or ulcercations.  Neurological: Denies dizziness, difficulty with memory, difficulty with speech or problems with balance and coordination.   No other specific complaints in a complete review of systems (except as listed in HPI above).  Objective:   Physical Exam   BP 92/62  Pulse 123  Temp(Src) 98.5 F (36.9 C) (Oral)  Ht 5\' 7"  (1.702 m)  Wt 85 lb 8 oz (38.783 kg)  BMI 13.39 kg/m2  SpO2 95% Wt Readings from Last 3 Encounters:  01/20/13 85 lb 8 oz (38.783 kg)  12/18/12 93 lb 12.8 oz (42.547 kg)  12/03/12 95 lb (43.092 kg)    General: Appears his stated age, well developed, under nourished in NAD. Skin: Warm, dry and intact. No rashes, lesions or ulcerations  noted. Cardiovascular: Normal rate and rhythm. S1,S2 noted.  No murmur, rubs or gallops noted. No JVD or BLE edema. No carotid bruits noted. Pulmonary/Chest: Normal effort and positive vesicular breath sounds. No respiratory distress. No wheezes, rales or ronchi noted.  Abdomen: Soft and nontender. Normal bowel sounds, no bruits noted. No distention or masses noted. Liver, spleen and kidneys non palpable. Musculoskeletal: Normal range of motion. No signs of joint swelling. No difficulty with gait. Crepitus noted of bilateral knees. Neurological: Alert and oriented. Cranial nerves II-XII intact. Coordination normal. +DTRs bilaterally.   BMET    Component Value Date/Time   NA 139 01/03/2013 1514   K 3.7 01/03/2013 1514   CL 102 01/03/2013 1514   CO2 30 01/03/2013 1514   GLUCOSE 93 01/03/2013 1514   BUN 14 01/03/2013 1514   CREATININE 0.86 01/03/2013 1514   CALCIUM 9.3 01/03/2013 1514   GFRNONAA >90 01/03/2013 1514   GFRAA >90 01/03/2013 1514    Lipid Panel     Component Value Date/Time   CHOL 161 12/03/2012 1116   TRIG 126.0 12/03/2012 1116   HDL 44.80 12/03/2012 1116   CHOLHDL 4 12/03/2012 1116   VLDL 25.2 12/03/2012 1116   LDLCALC 91 12/03/2012 1116    CBC    Component Value Date/Time   WBC 9.4 01/03/2013 1514   RBC 3.87* 01/03/2013 1514   HGB 11.1* 01/03/2013 1514   HCT 33.6* 01/03/2013 1514   PLT 366 01/03/2013 1514   MCV 86.8 01/03/2013 1514   MCH 28.7 01/03/2013 1514   MCHC 33.0 01/03/2013 1514   RDW 13.6 01/03/2013 1514   LYMPHSABS 1.2 01/03/2013 1514   MONOABS 0.8 01/03/2013 1514   EOSABS 0.1 01/03/2013 1514   BASOSABS 0.0 01/03/2013 1514    Hgb A1C No results found for this basename: HGBA1C        Assessment & Plan:

## 2013-01-27 ENCOUNTER — Other Ambulatory Visit: Payer: Medicare Other | Admitting: Gastroenterology

## 2013-02-02 ENCOUNTER — Ambulatory Visit (INDEPENDENT_AMBULATORY_CARE_PROVIDER_SITE_OTHER): Payer: Medicare HMO | Admitting: Family Medicine

## 2013-02-02 DIAGNOSIS — Z952 Presence of prosthetic heart valve: Secondary | ICD-10-CM

## 2013-02-02 DIAGNOSIS — Z954 Presence of other heart-valve replacement: Secondary | ICD-10-CM

## 2013-02-02 DIAGNOSIS — Z7901 Long term (current) use of anticoagulants: Secondary | ICD-10-CM

## 2013-02-02 LAB — POCT INR: INR: 4.8

## 2013-02-04 ENCOUNTER — Other Ambulatory Visit: Payer: Self-pay | Admitting: Internal Medicine

## 2013-02-11 ENCOUNTER — Telehealth: Payer: Self-pay | Admitting: *Deleted

## 2013-02-11 DIAGNOSIS — M1990 Unspecified osteoarthritis, unspecified site: Secondary | ICD-10-CM

## 2013-02-11 NOTE — Telephone Encounter (Signed)
Pt's spouse is requesting 3 month supply of Norco 10-325mg  be sent to [facility] mail order [facility]-last written 01/20/2013 #30 with 0 refills-please advise.

## 2013-02-12 MED ORDER — HYDROCODONE-ACETAMINOPHEN 10-325 MG PO TABS: 1.0000 | ORAL_TABLET | Freq: Three times a day (TID) | ORAL | Status: DC | PRN

## 2013-02-12 NOTE — Telephone Encounter (Signed)
Ok to refill but but in comments put on or after 02/20/13

## 2013-02-12 NOTE — Telephone Encounter (Signed)
Printed per instructions and faxed to [facility] at [phone]

## 2013-03-03 ENCOUNTER — Ambulatory Visit: Payer: Medicare HMO | Admitting: Internal Medicine

## 2013-03-03 DIAGNOSIS — Z0289 Encounter for other administrative examinations: Secondary | ICD-10-CM

## 2013-03-09 ENCOUNTER — Ambulatory Visit: Payer: Medicare Other | Admitting: Internal Medicine

## 2013-03-17 ENCOUNTER — Ambulatory Visit (INDEPENDENT_AMBULATORY_CARE_PROVIDER_SITE_OTHER): Payer: Medicare HMO | Admitting: General Practice

## 2013-03-17 DIAGNOSIS — Z7901 Long term (current) use of anticoagulants: Secondary | ICD-10-CM

## 2013-03-17 DIAGNOSIS — Z954 Presence of other heart-valve replacement: Secondary | ICD-10-CM

## 2013-03-17 DIAGNOSIS — Z952 Presence of prosthetic heart valve: Secondary | ICD-10-CM

## 2013-03-26 ENCOUNTER — Ambulatory Visit (INDEPENDENT_AMBULATORY_CARE_PROVIDER_SITE_OTHER): Payer: Medicare HMO | Admitting: General Practice

## 2013-03-26 DIAGNOSIS — Z7901 Long term (current) use of anticoagulants: Secondary | ICD-10-CM

## 2013-03-26 DIAGNOSIS — Z954 Presence of other heart-valve replacement: Secondary | ICD-10-CM

## 2013-03-26 DIAGNOSIS — Z952 Presence of prosthetic heart valve: Secondary | ICD-10-CM

## 2013-04-02 ENCOUNTER — Ambulatory Visit (INDEPENDENT_AMBULATORY_CARE_PROVIDER_SITE_OTHER): Payer: Medicare HMO | Admitting: General Practice

## 2013-04-02 DIAGNOSIS — Z952 Presence of prosthetic heart valve: Secondary | ICD-10-CM

## 2013-04-02 DIAGNOSIS — Z954 Presence of other heart-valve replacement: Secondary | ICD-10-CM

## 2013-04-02 DIAGNOSIS — Z7901 Long term (current) use of anticoagulants: Secondary | ICD-10-CM

## 2013-04-02 LAB — POCT INR: INR: 1.9

## 2013-04-21 ENCOUNTER — Encounter: Payer: Self-pay | Admitting: Internal Medicine

## 2013-04-21 ENCOUNTER — Ambulatory Visit (INDEPENDENT_AMBULATORY_CARE_PROVIDER_SITE_OTHER): Payer: Medicare HMO | Admitting: Internal Medicine

## 2013-04-21 VITALS — BP 110/78 | HR 114 | Temp 98.7°F | Ht 67.0 in | Wt 88.5 lb

## 2013-04-21 DIAGNOSIS — M129 Arthropathy, unspecified: Secondary | ICD-10-CM

## 2013-04-21 DIAGNOSIS — M1990 Unspecified osteoarthritis, unspecified site: Secondary | ICD-10-CM

## 2013-04-21 DIAGNOSIS — J449 Chronic obstructive pulmonary disease, unspecified: Secondary | ICD-10-CM

## 2013-04-21 DIAGNOSIS — E46 Unspecified protein-calorie malnutrition: Secondary | ICD-10-CM

## 2013-04-21 DIAGNOSIS — E039 Hypothyroidism, unspecified: Secondary | ICD-10-CM

## 2013-04-21 DIAGNOSIS — Z7901 Long term (current) use of anticoagulants: Secondary | ICD-10-CM

## 2013-04-21 MED ORDER — HYDROCODONE-ACETAMINOPHEN 10-325 MG PO TABS: 1.0000 | ORAL_TABLET | Freq: Three times a day (TID) | ORAL | Status: DC | PRN

## 2013-04-21 MED ORDER — LEVOTHYROXINE SODIUM 75 MCG PO TABS: ORAL_TABLET | ORAL | Status: AC

## 2013-04-21 NOTE — Progress Notes (Signed)
Subjective:    Patient ID: Stanley Rodriguez, male    DOB: 1961/07/21, 52 y.o.   MRN: [id number]  HPI  Pt presents to the clinic today for 3 month follow up of chronic medical conditions:  Arthritis: on norco and tramdadol. Good control for now  COPD: sees Dr. Maple Hudson. On pulmicort. Experiencing some SOB with walking but not at rest. Still a current a smoker.  Malnourished: started on Megace at last visit. U[p 3 pounds.  Long term use of anticoagulants secondary to AVR: on coumadin. INR still not at goal. Sees coumadin clinic this Friday.  Hypothyroidism: well controlled, will need refills of synthroid.  Additionally he c/o of erectile dysfunction. This has been an issue for him in the past. He was on cialis prior and tolerated it well.  Review of Systems      Past Medical History  Diagnosis Date  . COPD (chronic obstructive pulmonary disease)   . Thyroid disease   . History of heart valve replacement   . Hypothyroid   . Arthritis   . Allergy   . Ulcer   . History of chicken pox     Current Outpatient Prescriptions  Medication Sig Dispense Refill  . arformoterol (BROVANA) 15 MCG/2ML NEBU Take 2 mLs (15 mcg total) by nebulization 2 (two) times daily.  120 mL  6  . budesonide (PULMICORT) 0.25 MG/2ML nebulizer solution Take 2 mLs (0.25 mg total) by nebulization 2 (two) times daily.  120 mL  6  . HYDROcodone-acetaminophen (NORCO) 10-325 MG per tablet Take 1 tablet by mouth every 8 (eight) hours as needed for pain (To Fill on or after 02/20/2013).  90 tablet  0  . levocetirizine (XYZAL) 5 MG tablet TAKE 1/2  TABLET  (2.5 MG) EVERY EVENING.  45 tablet  0  . levothyroxine (SYNTHROID, LEVOTHROID) 75 MCG tablet TAKE 1 TABLET EVERY DAY  30 tablet  PRN  . megestrol (MEGACE) 20 MG tablet Take 1 tablet (20 mg total) by mouth 2 (two) times daily.  60 tablet  2  . OXYGEN-HELIUM IN Inhale into the lungs. 2L [facility]      . traMADol (ULTRAM) 50 MG tablet TAKE 1 TABLET EVERY 8  HOURS AS NEEDED  FOR  PAIN  30 tablet  PRN  . warfarin (COUMADIN) 2 MG tablet TAKE 1 TABLET ONE TIME DAILY AS DIRECTED  BY  COUMADIN  CLINIC  120 tablet  PRN   No current facility-administered medications for this visit.    Allergies  Allergen Reactions  . Penicillins Nausea And Vomiting    Family History  Problem Relation Age of Onset  . Heart attack Father   . Colon cancer Neg Hx     History   Social History  . Marital Status: Married    Spouse Name: Neysa Bonito    Number of Children: 0  . Years of Education: 9   Occupational History  . Disabled    Social History Main Topics  . Smoking status: Current Every Day Smoker -- 1.00 packs/day for 25 years    Types: Cigarettes    Last Attempt to Quit: 07/27/2012  . Smokeless tobacco: Former Neurosurgeon    Types: Chew     Comment: Tobacco info given 12/18/12  . Alcohol Use: No     Comment: 1-2 per week  . Drug Use: No  . Sexual Activity: Not on file   Other Topics Concern  . Not on file   Social History Narrative  Regular exercise-no   Caffeine Use-yes     Constitutional: Denies fever, malaise, fatigue, headache or abrupt weight changes.  Respiratory: Pt reports shortness of breath with exertion. Denies difficulty breathing, cough or sputum production.   Cardiovascular: Denies chest pain, chest tightness, palpitations or swelling in the hands or feet.  GU: Pt reports erectile dysfunction. Denies urgency, frequency, pain with urination, burning sensation, blood in urine, odor or discharge. Musculoskeletal: Pt reports arthritis. Denies decrease in range of motion, difficulty with gait, muscle pain or joint swelling.    No other specific complaints in a complete review of systems (except as listed in HPI above).  Objective:   Physical Exam   BP 110/78  Pulse 114  Temp(Src) 98.7 F (37.1 C) (Oral)  Ht 5\' 7"  (1.702 m)  Wt 88 lb 8 oz (40.143 kg)  BMI 13.86 kg/m2  SpO2 99% Wt Readings from Last 3 Encounters:  04/21/13 88  lb 8 oz (40.143 kg)  01/20/13 85 lb 8 oz (38.783 kg)  12/18/12 93 lb 12.8 oz (42.547 kg)    General: Appears his stated age, malnourished in NAD. Cardiovascular: Normal rate and rhythm. S1,S2 noted.  No murmur, rubs or gallops noted. No JVD or BLE edema. No carotid bruits noted. Pulmonary/Chest: Normal effort and positive vesicular breath sounds. No respiratory distress. No wheezes, rales or ronchi noted.  Musculoskeletal: Normal range of motion. No signs of joint swelling. No difficulty with gait.   BMET    Component Value Date/Time   NA 139 01/03/2013 1514   K 3.7 01/03/2013 1514   CL 102 01/03/2013 1514   CO2 30 01/03/2013 1514   GLUCOSE 93 01/03/2013 1514   BUN 14 01/03/2013 1514   CREATININE 0.86 01/03/2013 1514   CALCIUM 9.3 01/03/2013 1514   GFRNONAA >90 01/03/2013 1514   GFRAA >90 01/03/2013 1514    Lipid Panel     Component Value Date/Time   CHOL 161 12/03/2012 1116   TRIG 126.0 12/03/2012 1116   HDL 44.80 12/03/2012 1116   CHOLHDL 4 12/03/2012 1116   VLDL 25.2 12/03/2012 1116   LDLCALC 91 12/03/2012 1116    CBC    Component Value Date/Time   WBC 9.4 01/03/2013 1514   RBC 3.87* 01/03/2013 1514   HGB 11.1* 01/03/2013 1514   HCT 33.6* 01/03/2013 1514   PLT 366 01/03/2013 1514   MCV 86.8 01/03/2013 1514   MCH 28.7 01/03/2013 1514   MCHC 33.0 01/03/2013 1514   RDW 13.6 01/03/2013 1514   LYMPHSABS 1.2 01/03/2013 1514   MONOABS 0.8 01/03/2013 1514   EOSABS 0.1 01/03/2013 1514   BASOSABS 0.0 01/03/2013 1514    Hgb A1C No results found for this basename: HGBA1C   .     Assessment & Plan:

## 2013-04-21 NOTE — Assessment & Plan Note (Signed)
Continue to follow with coumadin clinic Still not at goal

## 2013-04-21 NOTE — Patient Instructions (Signed)
Megestrol tablets What is this medicine? MEGESTROL (me JES trol) belongs to a class of drugs known as progestins. Megestrol tablets are used to treat advanced breast or endometrial cancer. This medicine may be used for other purposes; ask your health care provider or pharmacist if you have questions. What should I tell my health care provider before I take this medicine? They need to know if you have any of these conditions: -adrenal gland problems -history of blood clots of the legs, lungs, or other parts of the body -diabetes -kidney disease -liver disease -stroke -an unusual or allergic reaction to megestrol, other medicines, foods, dyes, or preservatives -pregnant or trying to get pregnant -breast-feeding How should I use this medicine? Take this medicine by mouth. Follow the directions on the prescription label. Do not take your medicine more often than directed. Take your doses at regular intervals. Do not stop taking except on the advice of your doctor or health care professional. Talk to your pediatrician regarding the use of this medicine in children. Special care may be needed. Overdosage: If you think you have taken too much of this medicine contact a poison control center or emergency room at once. NOTE: This medicine is only for you. Do not share this medicine with others. What if I miss a dose? If you miss a dose, take it as soon as you can. If it is almost time for your next dose, take only that dose. Do not take double or extra doses. What may interact with this medicine? Do not take this medicine with any of the following medications: -dofetilide This medicine may also interact with the following medications: -carbamazepine -indinavir -phenobarbital -phenytoin -primidone -rifampin -warfarin This list may not describe all possible interactions. Give your health care provider a list of all the medicines, herbs, non-prescription drugs, or dietary supplements you use.  Also tell them if you smoke, drink alcohol, or use illegal drugs. Some items may interact with your medicine. What should I watch for while using this medicine? Visit your doctor or health care professional for regular checks on your progress. Continue taking this medicine even if you feel better. It may take 2 months of regular use before you know if this medicine is working for your condition. If you are a male of child-bearing age, use an effective method of birth control while you are taking this medicine. This medicine should not be used by females who are pregnant or breast-feeding. There is a potential for serious side effects to an unborn child or to an infant. Talk to your health care professional or pharmacist for more information. If you have diabetes, this medicine may affect blood sugar levels. Check your blood sugar and talk to your doctor or health care professional if you notice changes. What side effects may I notice from receiving this medicine? Side effects that you should report to your doctor or health care professional as soon as possible: -difficulty breathing or shortness of breath -chest pain -dizziness -fluid retention -increased blood pressure -leg pain or swelling -nausea and vomiting -skin rash or itching -weakness Side effects that usually do not require medical attention (report to your doctor or health care professional if they continue or are bothersome): -breakthrough menstrual bleeding -hot flashes or flushing -increased appetite -mood changes -sweating -weight gain This list may not describe all possible side effects. Call your doctor for medical advice about side effects. You may report side effects to FDA at 1-800-FDA-1088. Where should I keep my medicine? Keep out  of the reach of children. Store at controlled room temperature between 15 and 30 degrees C (59 and 86 degrees F). Protect from heat above 40 degrees C (104 degrees F). Throw away any unused  medicine after the expiration date. NOTE: This sheet is a summary. It may not cover all possible information. If you have questions about this medicine, talk to your doctor, pharmacist, or health care provider.  2013, [employer]. (02/29/2008 3:57:10 PM)

## 2013-04-21 NOTE — Assessment & Plan Note (Signed)
TSH reviewed Synthroid refilled  RTC in 6 months to recheck

## 2013-04-21 NOTE — Assessment & Plan Note (Signed)
Continue current therapy 

## 2013-04-21 NOTE — Assessment & Plan Note (Signed)
Continue megace Up 3 pounds

## 2013-04-21 NOTE — Assessment & Plan Note (Signed)
Continue current meds Continue to follow with Dr. Maple Hudson

## 2013-04-23 ENCOUNTER — Ambulatory Visit (INDEPENDENT_AMBULATORY_CARE_PROVIDER_SITE_OTHER): Payer: Medicare HMO | Admitting: General Practice

## 2013-04-23 DIAGNOSIS — Z7901 Long term (current) use of anticoagulants: Secondary | ICD-10-CM

## 2013-04-23 DIAGNOSIS — Z952 Presence of prosthetic heart valve: Secondary | ICD-10-CM

## 2013-04-23 DIAGNOSIS — Z954 Presence of other heart-valve replacement: Secondary | ICD-10-CM

## 2013-04-29 ENCOUNTER — Ambulatory Visit: Payer: Self-pay | Admitting: Internal Medicine

## 2013-05-04 ENCOUNTER — Encounter: Payer: Self-pay | Admitting: Internal Medicine

## 2013-05-14 ENCOUNTER — Telehealth: Payer: Self-pay | Admitting: Internal Medicine

## 2013-05-14 NOTE — Telephone Encounter (Signed)
Spoke with patients spouse Patient needs to requalify for his o2 so [employer] will cover it Spouse requesting to come in next Thursday (so she will have $ for copay) Patient scheduled for Thursday 9/25 @ 1pm Will forward to Florentina Addison so she is aware

## 2013-05-18 ENCOUNTER — Telehealth: Payer: Self-pay

## 2013-05-18 NOTE — Telephone Encounter (Signed)
A little too early. He needs to call back Friday

## 2013-05-18 NOTE — Telephone Encounter (Signed)
Refill request for Loratab

## 2013-05-19 NOTE — Telephone Encounter (Signed)
Spoke with patients wife and told her its too early for refill to call back Friday, she was fine with that, stated she would...ds,cma

## 2013-05-20 ENCOUNTER — Ambulatory Visit: Payer: Medicare HMO

## 2013-05-21 ENCOUNTER — Telehealth: Payer: Self-pay | Admitting: *Deleted

## 2013-05-21 ENCOUNTER — Ambulatory Visit (INDEPENDENT_AMBULATORY_CARE_PROVIDER_SITE_OTHER): Payer: Medicare HMO | Admitting: General Practice

## 2013-05-21 ENCOUNTER — Telehealth: Payer: Self-pay | Admitting: Internal Medicine

## 2013-05-21 DIAGNOSIS — Z7901 Long term (current) use of anticoagulants: Secondary | ICD-10-CM

## 2013-05-21 DIAGNOSIS — Z952 Presence of prosthetic heart valve: Secondary | ICD-10-CM

## 2013-05-21 DIAGNOSIS — M1990 Unspecified osteoarthritis, unspecified site: Secondary | ICD-10-CM

## 2013-05-21 DIAGNOSIS — Z954 Presence of other heart-valve replacement: Secondary | ICD-10-CM

## 2013-05-21 MED ORDER — HYDROCODONE-ACETAMINOPHEN 10-325 MG PO TABS: 1.0000 | ORAL_TABLET | Freq: Three times a day (TID) | ORAL | Status: DC | PRN

## 2013-05-21 NOTE — Telephone Encounter (Signed)
error 

## 2013-05-21 NOTE — Telephone Encounter (Signed)
Rx sent, pt advised 

## 2013-05-21 NOTE — Telephone Encounter (Signed)
Stanley Rodriguez called requesting Loratab 10/325mg  refill.  Last refill 8.28.2014.  Please advise

## 2013-05-21 NOTE — Telephone Encounter (Signed)
Ok to refill, put in comment section "fill on or after 05/23/13"

## 2013-06-18 ENCOUNTER — Ambulatory Visit: Payer: Medicare HMO

## 2013-06-22 ENCOUNTER — Telehealth: Payer: Self-pay

## 2013-06-22 ENCOUNTER — Other Ambulatory Visit: Payer: Self-pay

## 2013-06-22 ENCOUNTER — Telehealth: Payer: Self-pay | Admitting: Internal Medicine

## 2013-06-22 DIAGNOSIS — M1990 Unspecified osteoarthritis, unspecified site: Secondary | ICD-10-CM

## 2013-06-22 MED ORDER — HYDROCODONE-ACETAMINOPHEN 10-325 MG PO TABS: 1.0000 | ORAL_TABLET | Freq: Three times a day (TID) | ORAL | Status: DC | PRN

## 2013-06-22 NOTE — Telephone Encounter (Signed)
Notified patients wife that it could be picked up today...ds,cma

## 2013-06-22 NOTE — Telephone Encounter (Signed)
06/22/2013   Pts contact left message requesting refill on RX : HYDROcodone-acetaminophen (NORCO) 10-325 MG per tablet .  Pt contact # is [phone]

## 2013-06-22 NOTE — Telephone Encounter (Signed)
Ok to refill to be picked up today

## 2013-06-22 NOTE — Telephone Encounter (Signed)
See msg below.    Please advise.

## 2013-06-22 NOTE — Telephone Encounter (Signed)
Ok to refill on or after 06/23/13 (in comments)

## 2013-06-22 NOTE — Telephone Encounter (Signed)
Pt wife is requesting to come pick up rx for hydrocodone, please advise...ds,cma

## 2013-07-21 ENCOUNTER — Other Ambulatory Visit: Payer: Self-pay | Admitting: General Practice

## 2013-07-21 MED ORDER — WARFARIN SODIUM 2 MG PO TABS: ORAL_TABLET | ORAL | Status: DC

## 2013-07-26 ENCOUNTER — Other Ambulatory Visit: Payer: Self-pay

## 2013-07-26 DIAGNOSIS — M1990 Unspecified osteoarthritis, unspecified site: Secondary | ICD-10-CM

## 2013-07-26 NOTE — Telephone Encounter (Signed)
Mrs Haff left v/m requesting rx hydrocodone apap. Call when ready for pick up. Nicki Reaper NP out of office.

## 2013-07-27 MED ORDER — HYDROCODONE-ACETAMINOPHEN 10-325 MG PO TABS: 1.0000 | ORAL_TABLET | Freq: Three times a day (TID) | ORAL | Status: DC | PRN

## 2013-07-27 NOTE — Telephone Encounter (Signed)
Patient's wife notified and Rx placed up front for pick up. Advised patient will need to p/u Rx and bring ID. Understanding verbalized.

## 2013-07-27 NOTE — Telephone Encounter (Signed)
Printed and placed in Kim's box. 

## 2013-07-28 ENCOUNTER — Ambulatory Visit (INDEPENDENT_AMBULATORY_CARE_PROVIDER_SITE_OTHER): Payer: Medicare HMO | Admitting: General Practice

## 2013-07-28 ENCOUNTER — Encounter: Payer: Self-pay | Admitting: Internal Medicine

## 2013-07-28 DIAGNOSIS — Z952 Presence of prosthetic heart valve: Secondary | ICD-10-CM

## 2013-07-28 DIAGNOSIS — Z7901 Long term (current) use of anticoagulants: Secondary | ICD-10-CM

## 2013-07-28 DIAGNOSIS — Z954 Presence of other heart-valve replacement: Secondary | ICD-10-CM

## 2013-07-28 LAB — POCT INR: INR: 2.9

## 2013-07-28 NOTE — Progress Notes (Signed)
Pre-visit discussion using our clinic review tool. No additional management support is needed unless otherwise documented below in the visit note.  

## 2013-08-04 ENCOUNTER — Ambulatory Visit: Payer: Medicare HMO | Admitting: Internal Medicine

## 2013-08-04 DIAGNOSIS — Z0289 Encounter for other administrative examinations: Secondary | ICD-10-CM

## 2013-08-17 ENCOUNTER — Encounter (HOSPITAL_COMMUNITY): Payer: Self-pay | Admitting: Emergency Medicine

## 2013-08-17 ENCOUNTER — Emergency Department (HOSPITAL_COMMUNITY)
Admission: EM | Admit: 2013-08-17 | Discharge: 2013-08-17 | Disposition: A | Discharge status: Home / Self Care | Payer: Medicare HMO | Attending: Emergency Medicine | Admitting: Emergency Medicine

## 2013-08-17 ENCOUNTER — Emergency Department (HOSPITAL_COMMUNITY): Payer: Medicare HMO

## 2013-08-17 DIAGNOSIS — E039 Hypothyroidism, unspecified: Secondary | ICD-10-CM | POA: Insufficient documentation

## 2013-08-17 DIAGNOSIS — Z7901 Long term (current) use of anticoagulants: Secondary | ICD-10-CM | POA: Insufficient documentation

## 2013-08-17 DIAGNOSIS — Z880 Allergy status to penicillin: Secondary | ICD-10-CM | POA: Insufficient documentation

## 2013-08-17 DIAGNOSIS — F17200 Nicotine dependence, unspecified, uncomplicated: Secondary | ICD-10-CM | POA: Insufficient documentation

## 2013-08-17 DIAGNOSIS — M129 Arthropathy, unspecified: Secondary | ICD-10-CM | POA: Insufficient documentation

## 2013-08-17 DIAGNOSIS — E871 Hypo-osmolality and hyponatremia: Secondary | ICD-10-CM | POA: Insufficient documentation

## 2013-08-17 DIAGNOSIS — R55 Syncope and collapse: Secondary | ICD-10-CM

## 2013-08-17 DIAGNOSIS — J449 Chronic obstructive pulmonary disease, unspecified: Secondary | ICD-10-CM | POA: Insufficient documentation

## 2013-08-17 DIAGNOSIS — Z954 Presence of other heart-valve replacement: Secondary | ICD-10-CM | POA: Insufficient documentation

## 2013-08-17 DIAGNOSIS — E878 Other disorders of electrolyte and fluid balance, not elsewhere classified: Secondary | ICD-10-CM | POA: Insufficient documentation

## 2013-08-17 DIAGNOSIS — Z872 Personal history of diseases of the skin and subcutaneous tissue: Secondary | ICD-10-CM | POA: Insufficient documentation

## 2013-08-17 DIAGNOSIS — J4489 Other specified chronic obstructive pulmonary disease: Secondary | ICD-10-CM | POA: Insufficient documentation

## 2013-08-17 DIAGNOSIS — Z8619 Personal history of other infectious and parasitic diseases: Secondary | ICD-10-CM | POA: Insufficient documentation

## 2013-08-17 DIAGNOSIS — R209 Unspecified disturbances of skin sensation: Secondary | ICD-10-CM | POA: Insufficient documentation

## 2013-08-17 DIAGNOSIS — Z79899 Other long term (current) drug therapy: Secondary | ICD-10-CM | POA: Insufficient documentation

## 2013-08-17 DIAGNOSIS — R011 Cardiac murmur, unspecified: Secondary | ICD-10-CM | POA: Insufficient documentation

## 2013-08-17 DIAGNOSIS — R42 Dizziness and giddiness: Secondary | ICD-10-CM | POA: Insufficient documentation

## 2013-08-17 LAB — BASIC METABOLIC PANEL
BUN: 18 mg/dL (ref 6–23)
CO2: 25 mEq/L (ref 19–32)
Calcium: 9.5 mg/dL (ref 8.4–10.5)
Creatinine, Ser: 0.87 mg/dL (ref 0.50–1.35)
Glucose, Bld: 93 mg/dL (ref 70–99)

## 2013-08-17 LAB — POCT I-STAT TROPONIN I

## 2013-08-17 LAB — CBC
Hemoglobin: 11.7 g/dL — ABNORMAL LOW (ref 13.0–17.0)
MCH: 26.8 pg (ref 26.0–34.0)
MCV: 84.4 fL (ref 78.0–100.0)
RBC: 4.37 MIL/uL (ref 4.22–5.81)

## 2013-08-17 MED ORDER — SODIUM CHLORIDE 0.9 % IV BOLUS (SEPSIS)
1000.0000 mL | Freq: Once | INTRAVENOUS | Status: AC
  Administered 2013-08-17: 1000 mL via INTRAVENOUS

## 2013-08-17 NOTE — ED Notes (Addendum)
Per EMS: Pt reports generalized weakness that started earlier this morning. Pt states his lips were "tingling and numb" while laying in bed. Pt reports open heart surgery 14 years ago and had an artificial heart valve placed in 2000. Pt is on coumadin. Pt reports nausea and dizziness, however denies shortness of breath. Pt is A/O x4, has equal strength in all extremities, there is no pronator drift, and family reports that his speech is normal for self.

## 2013-08-18 NOTE — ED Provider Notes (Signed)
CSN: [id number]     Arrival date & time 08/17/13  1353 History   First MD Initiated Contact with Patient 08/17/13 1643     Chief Complaint  Patient presents with  . Weakness   (Consider location/radiation/quality/duration/timing/severity/associated sxs/prior Treatment) HPI Comments: Pt is a 52 y.o. male with Pmhx as above who presents with a 5-10 min episode of dizziness described as lightheadedness w/ assoc lip numbness while sitting on couch today.  Denies CP, SOB, palpations, numbness, weakness, fever, chills, n/v, d/a, diaphoresis.  Symptoms have not returned in several hours.  No aggravating or alleviating symptoms. No other associated symptoms. No recent illness or injury.  No hx of similar.   Patient is a 52 y.o. male presenting with weakness.  Weakness Pertinent negatives include no chest pain, no abdominal pain, no headaches and no shortness of breath.    Past Medical History  Diagnosis Date  . COPD (chronic obstructive pulmonary disease)   . Thyroid disease   . History of heart valve replacement   . Hypothyroid   . Arthritis   . Allergy   . Ulcer   . History of chicken pox    Past Surgical History  Procedure Laterality Date  . Cardiac surgery    . Lung surgery  03/1999  . Cardiac valve surgery  10/1998    metal valve  . Lung biopsy  01/2010  . Aorta surgery     Family History  Problem Relation Age of Onset  . Heart attack Father   . Colon cancer Neg Hx    History  Substance Use Topics  . Smoking status: Current Every Day Smoker -- 1.00 packs/day for 25 years    Types: Cigarettes    Last Attempt to Quit: 07/27/2012  . Smokeless tobacco: Former Neurosurgeon    Types: Chew     Comment: Tobacco info given 12/18/12  . Alcohol Use: No     Comment: 1-2 per week    Review of Systems  Constitutional: Negative for fever, activity change, appetite change and fatigue.  HENT: Negative for congestion, facial swelling, rhinorrhea and trouble swallowing.   Eyes: Negative for  photophobia and pain.  Respiratory: Negative for cough, chest tightness and shortness of breath.   Cardiovascular: Negative for chest pain and leg swelling.  Gastrointestinal: Negative for nausea, vomiting, abdominal pain, diarrhea and constipation.  Endocrine: Negative for polydipsia and polyuria.  Genitourinary: Negative for dysuria, urgency, decreased urine volume and difficulty urinating.  Musculoskeletal: Negative for back pain and gait problem.  Skin: Negative for color change, rash and wound.  Allergic/Immunologic: Negative for immunocompromised state.  Neurological: Positive for dizziness. Negative for facial asymmetry, speech difficulty, weakness, numbness and headaches.  Psychiatric/Behavioral: Negative for confusion, decreased concentration and agitation.    Allergies  Penicillins  Home Medications   Current Outpatient Rx  Name  Route  Sig  Dispense  Refill  . arformoterol (BROVANA) 15 MCG/2ML NEBU   Nebulization   Take 2 mLs (15 mcg total) by nebulization 2 (two) times daily.   120 mL   6     DX 493.20   . budesonide (PULMICORT) 0.25 MG/2ML nebulizer solution   Nebulization   Take 2 mLs (0.25 mg total) by nebulization 2 (two) times daily.   120 mL   6     DX 493.20   . HYDROcodone-acetaminophen (NORCO) 10-325 MG per tablet   Oral   Take 1 tablet by mouth every 8 (eight) hours as needed.   90 tablet  0   . levocetirizine (XYZAL) 5 MG tablet   Oral   Take 2.5 mg by mouth every evening.         Marland Kitchen levothyroxine (SYNTHROID, LEVOTHROID) 75 MCG tablet      TAKE 1 TABLET EVERY DAY   30 tablet   2   . megestrol (MEGACE) 20 MG tablet   Oral   Take 1 tablet (20 mg total) by mouth 2 (two) times daily.   60 tablet   2   . warfarin (COUMADIN) 2 MG tablet      2 mg. Monday, Wednesday and Friday takes 2 1/2 tab          BP 133/60  Pulse 55  Temp(Src) 97.7 F (36.5 C) (Oral)  Resp 18  SpO2 100% Physical Exam  Constitutional: He is oriented to  person, place, and time. He appears well-developed and well-nourished. No distress.  HENT:  Head: Normocephalic and atraumatic.  Mouth/Throat: No oropharyngeal exudate.  Eyes: Pupils are equal, round, and reactive to light.  Neck: Normal range of motion. Neck supple.  Cardiovascular: Normal rate and regular rhythm.  Exam reveals no gallop and no friction rub.   Murmur (mechanical valve) heard. Pulmonary/Chest: Effort normal and breath sounds normal. No respiratory distress. He has no wheezes. He has no rales.  Abdominal: Soft. Bowel sounds are normal. He exhibits no distension and no mass. There is no tenderness. There is no rebound and no guarding.  Musculoskeletal: Normal range of motion. He exhibits no edema and no tenderness.  Neurological: He is alert and oriented to person, place, and time.  Skin: Skin is warm and dry.  Psychiatric: He has a normal mood and affect.    ED Course  Procedures (including critical care time) Labs Review Labs Reviewed  CBC - Abnormal; Notable for the following:    Hemoglobin 11.7 (*)    HCT 36.9 (*)    All other components within normal limits  BASIC METABOLIC PANEL - Abnormal; Notable for the following:    Sodium 128 (*)    Chloride 92 (*)    All other components within normal limits  POCT I-STAT TROPONIN I   Imaging Review Dg Chest 2 View  08/17/2013   CLINICAL DATA:  Shortness of breath, chest pain, COPD  EXAM: CHEST  2 VIEW  COMPARISON:  CT chest October 01, 2012  FINDINGS: The heart size and mediastinal contours are stable. Patient is status post prior median sternotomy with cardiac valvular replacement ring in place. There is stable bullous change of the right apex unchanged compared to prior CT. Stable scarring/consolidation of left apex is unchanged compared to prior exam. There is a stable calcified granuloma in the lateral left upper to mid lung unchanged. The visualized skeletal structures are stable. There is scoliosis of spine.   IMPRESSION: Marked emphysema with left apical scar and right apical bullous change, stable compared to prior exam.   Electronically Signed   By: Sherian Rein M.D.   On: 08/17/2013 14:41    EKG Interpretation    Date/Time:  Tuesday August 17 2013 14:05:57 EST Ventricular Rate:  76 PR Interval:  160 QRS Duration: 74 QT Interval:  364 QTC Calculation: 409 R Axis:   59 Text Interpretation:  Sinus rhythm Right atrial enlargement Consider left ventricular hypertrophy ST elevation in ant/lat/inf seen on multiple prior, likely represents repolarization. No significant change since last tracing Confirmed by Ernestine Rohman  MD, Siriyah Ambrosius [phone]) on 08/18/2013 10:46:10 AM  MDM   1. Near syncope   2. Hyponatremia   3. Hypochloremia    Pt is a 52 y.o. male with Pmhx as above who presents with a 5-10 min episode of dizziness described as lightheadedness w/ assoc lip numbness while sitting on couch today.  Denies CP, SOB, palpations, numbness, weakness, fever, chills, n/v, d/a, diaphoresis.  Symptoms have not returned in several hours.  On PE, VSS, pt in NAD. Neuro exam including ambulation nml and did not induce symptoms. Trop neg, CXR w/o acute findings.  BMP w/ hyponatremia, hypochloremia.  Pt given 1L NS, though I do not think this is cause of symptoms.  I doubt ACS, CVA, arrhythmia and think episode was likely vasovagal.  Pt can f/u closely w/ PCP for repeat electrolytes.  Return precautions given for new or worsening symptoms including return of symptoms, syncope, CP, SOB, numbness, weakness.          Shanna Cisco, MD 08/18/13 1049

## 2013-08-23 ENCOUNTER — Other Ambulatory Visit: Payer: Self-pay

## 2013-08-23 DIAGNOSIS — M1990 Unspecified osteoarthritis, unspecified site: Secondary | ICD-10-CM

## 2013-08-23 NOTE — Telephone Encounter (Signed)
Pt left v/m requesting rx hydrocodone apap. Call when ready for pick up.  

## 2013-08-24 ENCOUNTER — Other Ambulatory Visit: Payer: Self-pay

## 2013-08-24 MED ORDER — HYDROCODONE-ACETAMINOPHEN 10-325 MG PO TABS: 1.0000 | ORAL_TABLET | Freq: Three times a day (TID) | ORAL | Status: AC | PRN

## 2013-08-24 NOTE — Telephone Encounter (Signed)
Pt is aware and also aware that he will need to provide a urine sample Rx left in front office for pick up

## 2013-08-27 ENCOUNTER — Encounter: Payer: Self-pay | Admitting: Internal Medicine

## 2013-09-23 ENCOUNTER — Encounter: Payer: Self-pay | Admitting: Internal Medicine

## 2013-10-22 ENCOUNTER — Ambulatory Visit: Payer: Medicare HMO | Admitting: Internal Medicine

## 2013-11-09 ENCOUNTER — Telehealth: Payer: Self-pay | Admitting: General Practice

## 2013-11-09 NOTE — Telephone Encounter (Signed)
Invalid phone number.

## 2013-11-16 ENCOUNTER — Encounter: Payer: Self-pay | Admitting: General Practice

## 2013-12-10 ENCOUNTER — Encounter: Payer: Self-pay | Admitting: General Practice

## 2013-12-23 ENCOUNTER — Encounter: Payer: Self-pay | Admitting: General Practice

## 2014-02-02 ENCOUNTER — Ambulatory Visit (INDEPENDENT_AMBULATORY_CARE_PROVIDER_SITE_OTHER): Payer: Medicare HMO | Admitting: General Practice

## 2014-02-02 DIAGNOSIS — Z954 Presence of other heart-valve replacement: Secondary | ICD-10-CM

## 2014-02-02 DIAGNOSIS — Z952 Presence of prosthetic heart valve: Secondary | ICD-10-CM

## 2014-02-02 DIAGNOSIS — Z7901 Long term (current) use of anticoagulants: Secondary | ICD-10-CM

## 2014-05-07 IMAGING — CT CT CHEST W/ CM
2 of 3 series · 15 of 36 positions shown, 18 images · IV contrast (OMNIPAQUE)
Comparison: Chest radiograph on 08/03/2012

CLINICAL DATA: Cavitary lung disease and bronchiectasis.  Previous
right lung surgery for aspergilloma.  Smoker.

CT CHEST WITH CONTRAST
TECHNIQUE: Multidetector CT imaging of the chest was performed
following the standard protocol during bolus administration of
intravenous contrast.
Contrast: 80mL OMNIPAQUE IOHEXOL 300 MG/ML  SOLN

[Series 2: chest with st · axial · 0.71mm/px · z∈[-306,-16]mm · 12 of 68 slices shown, 15 images]
[im 5/68  mediastinal]
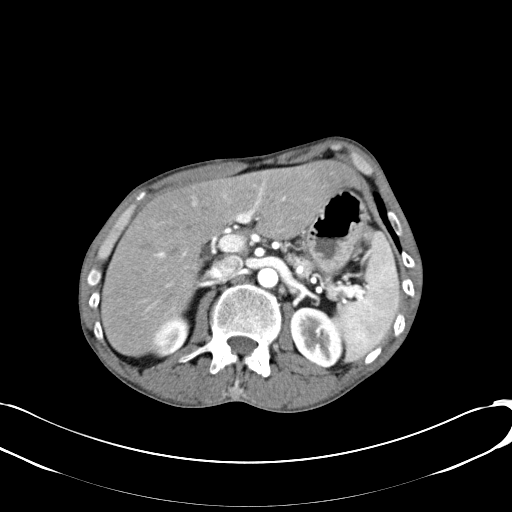
[im 5/68  lung]
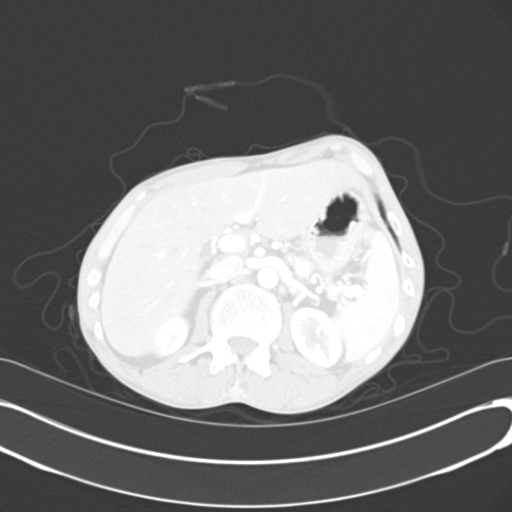
[im 10/68  lung]
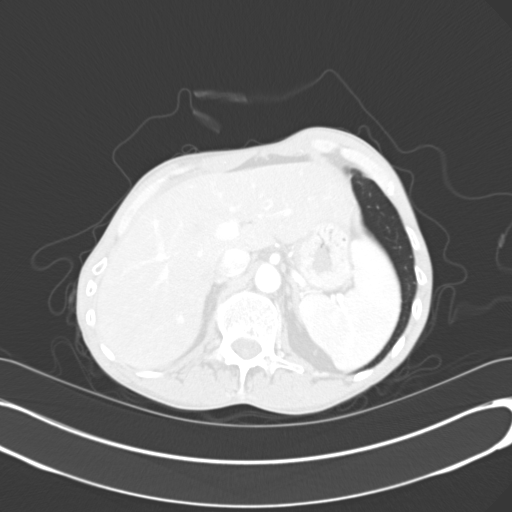
[im 15/68  lung]
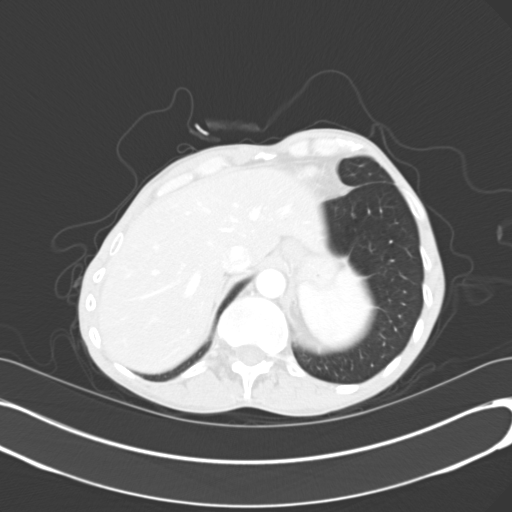
[im 20/68  lung]
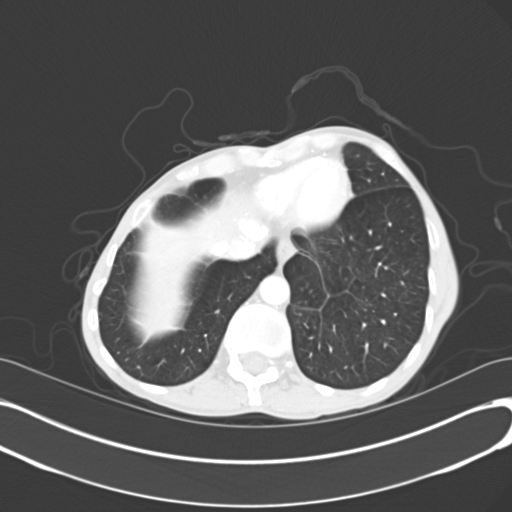
[im 25/68  mediastinal]
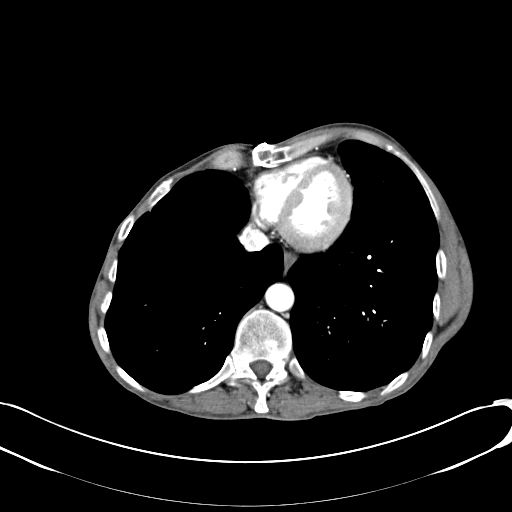
[im 25/68  lung]
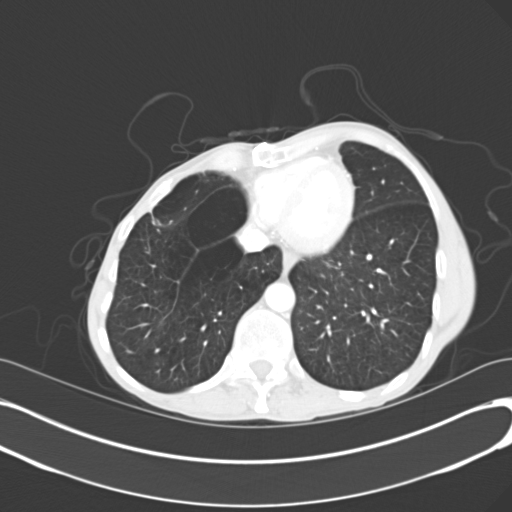
[im 30/68  lung]
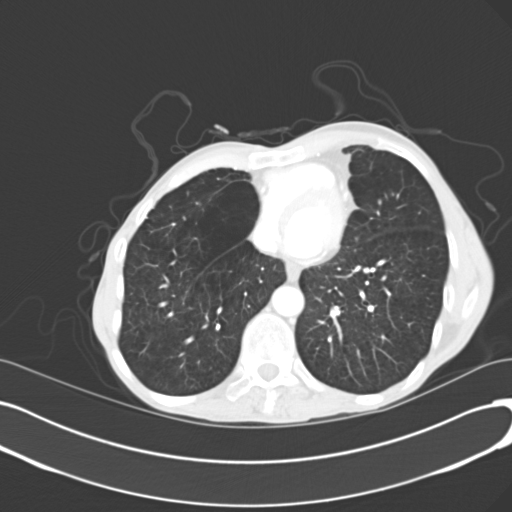
[im 38/68  lung]
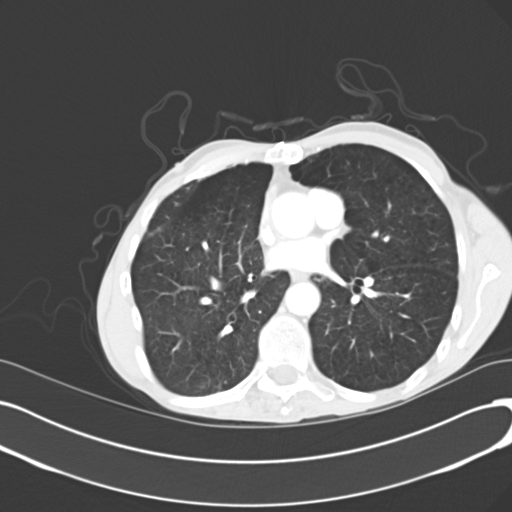
[im 43/68  lung]
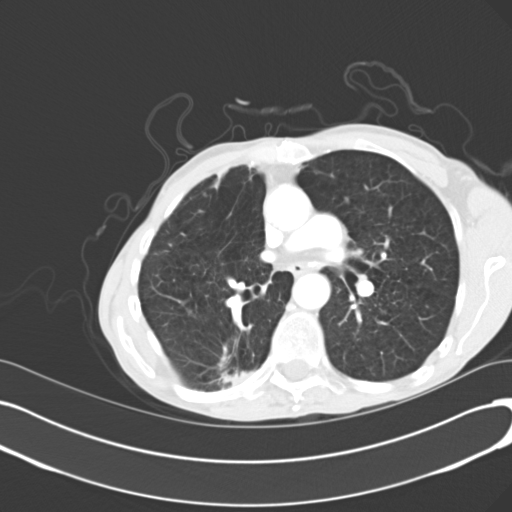
[im 48/68  mediastinal]
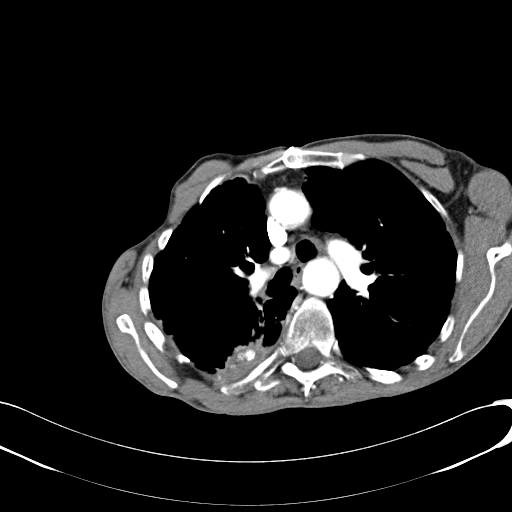
[im 48/68  lung]
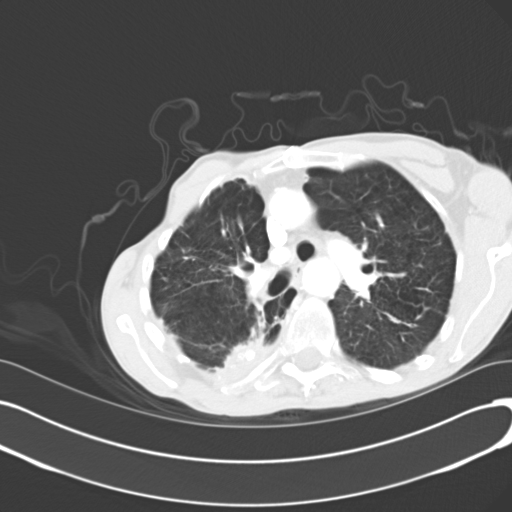
[im 53/68  lung]
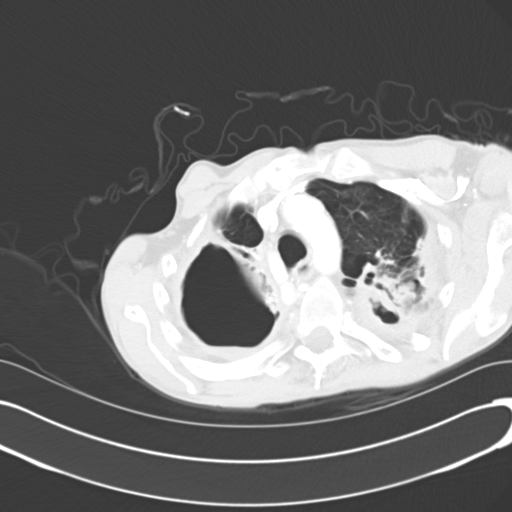
[im 58/68  lung]
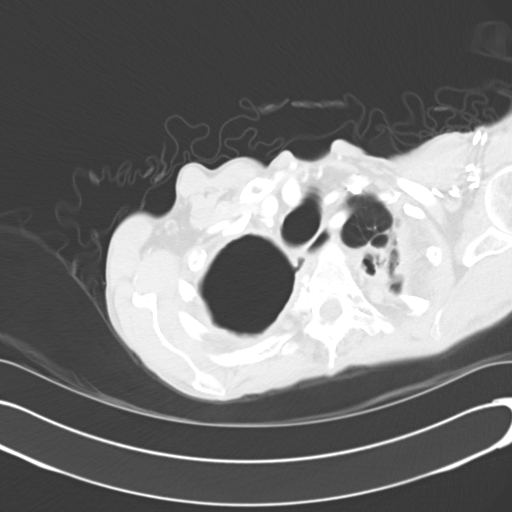
[im 63/68  lung]
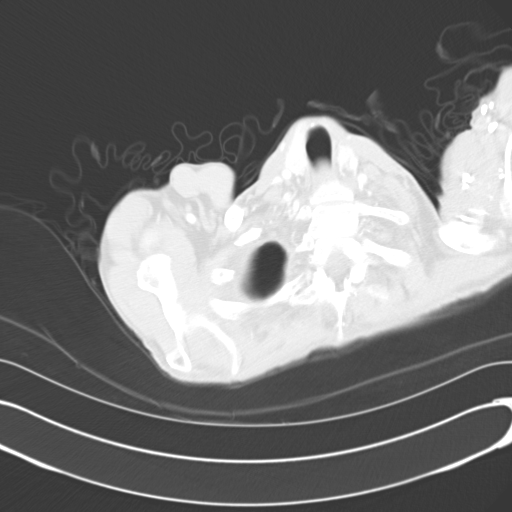

[Series 602: <mpr thick range> · coronal · 0.71mm/px · 3 of 71 slices shown]
[im 15/71  lung]
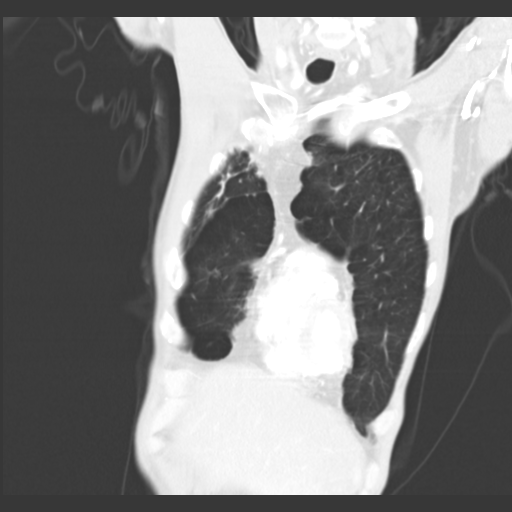
[im 29/71  lung]
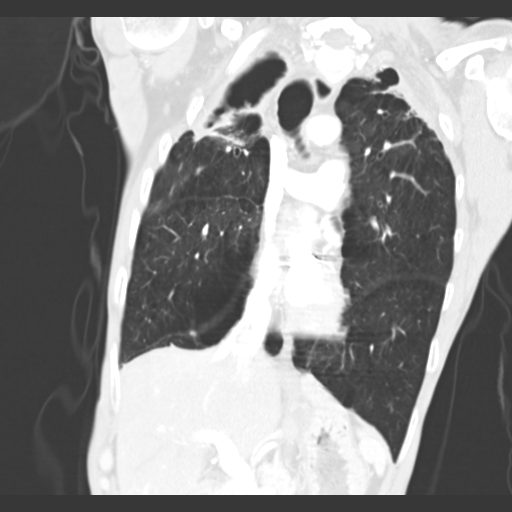
[im 43/71  lung]
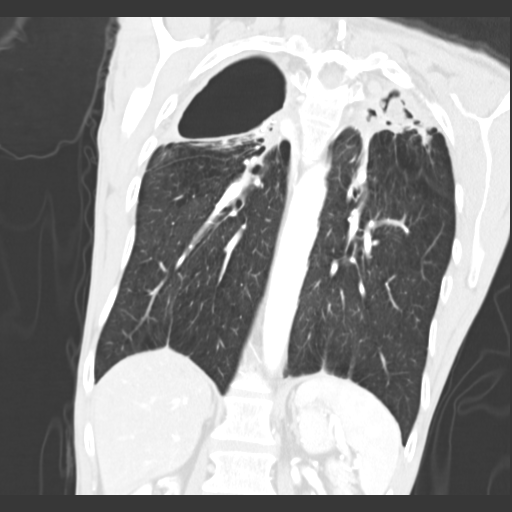

[15 of 36 positions shown; findings below may reference images not displayed]

FINDINGS: Moderate pulmonary emphysema noted.  Extensive pleural -
parenchymal scarring, bullous disease and mild bronchiectasis is
seen in both upper lobes.  There is a large bleb at the right lung
apex, with adjacent surgical staples.  A smaller bulla is seen in
the posterior left lung apex which contains a rounded filling
defect measuring 2 cm, suspicious for a mycetoma.  Superior
retraction of both hila are seen.

Calcified granuloma are noted in the lingula and left lower lobe.
No suspicious pulmonary nodules or masses are identified.  There is
no evidence of central endobronchial lesion.

No evidence of hilar or mediastinal masses.  No adenopathy seen
elsewhere within the thorax.  No evidence of pleural or pericardial
effusion.  Both adrenal glands are normal in appearance.
IMPRESSION: 1.
1.  Emphysema with extensive pleural - parenchymal scarring,
bullous disease, and mild bronchiectasis in both upper lobes.
2.  2 cm filling defect within a posterior left upper lobe bulla,
consistent with mycetoma.
3.  No evidence of neoplasm or other active lung disease.
